# Patient Record
Sex: Male | Born: 1945
Health system: Southern US, Community
[De-identification: ages and names within clinical notes are randomized; demographics above are authoritative.]

## PROBLEM LIST (undated history)

## (undated) DIAGNOSIS — T7840XA Allergy, unspecified, initial encounter: Secondary | ICD-10-CM

## (undated) DIAGNOSIS — I8390 Asymptomatic varicose veins of unspecified lower extremity: Secondary | ICD-10-CM

## (undated) HISTORY — DX: Asymptomatic varicose veins of unspecified lower extremity: I83.90

## (undated) HISTORY — DX: Allergy, unspecified, initial encounter: T78.40XA

---

## 2003-07-07 ENCOUNTER — Emergency Department (HOSPITAL_COMMUNITY): Admission: EM | Admit: 2003-07-07 | Discharge: 2003-07-07 | Payer: Self-pay | Admitting: Family Medicine

## 2003-07-09 ENCOUNTER — Emergency Department (HOSPITAL_COMMUNITY): Admission: EM | Admit: 2003-07-09 | Discharge: 2003-07-09 | Payer: Self-pay | Admitting: Family Medicine

## 2013-05-30 ENCOUNTER — Ambulatory Visit (INDEPENDENT_AMBULATORY_CARE_PROVIDER_SITE_OTHER): Payer: BC Managed Care – PPO | Admitting: Family Medicine

## 2013-05-30 VITALS — BP 126/64 | HR 61 | Temp 98.0°F | Resp 16 | Ht 66.0 in | Wt 137.0 lb

## 2013-05-30 DIAGNOSIS — Z8669 Personal history of other diseases of the nervous system and sense organs: Secondary | ICD-10-CM

## 2013-05-30 DIAGNOSIS — G629 Polyneuropathy, unspecified: Secondary | ICD-10-CM

## 2013-05-30 DIAGNOSIS — N529 Male erectile dysfunction, unspecified: Secondary | ICD-10-CM

## 2013-05-30 DIAGNOSIS — L299 Pruritus, unspecified: Secondary | ICD-10-CM | POA: Insufficient documentation

## 2013-05-30 DIAGNOSIS — R350 Frequency of micturition: Secondary | ICD-10-CM

## 2013-05-30 DIAGNOSIS — B351 Tinea unguium: Secondary | ICD-10-CM

## 2013-05-30 DIAGNOSIS — D649 Anemia, unspecified: Secondary | ICD-10-CM

## 2013-05-30 DIAGNOSIS — G609 Hereditary and idiopathic neuropathy, unspecified: Secondary | ICD-10-CM

## 2013-05-30 LAB — COMPREHENSIVE METABOLIC PANEL
ALBUMIN: 4.6 g/dL (ref 3.5–5.2)
ALT: 13 U/L (ref 0–53)
AST: 20 U/L (ref 0–37)
Alkaline Phosphatase: 71 U/L (ref 39–117)
BUN: 12 mg/dL (ref 6–23)
CALCIUM: 9.7 mg/dL (ref 8.4–10.5)
CHLORIDE: 105 meq/L (ref 96–112)
CO2: 26 meq/L (ref 19–32)
Creat: 0.86 mg/dL (ref 0.50–1.35)
Glucose, Bld: 91 mg/dL (ref 70–99)
Potassium: 5 mEq/L (ref 3.5–5.3)
Sodium: 140 mEq/L (ref 135–145)
TOTAL PROTEIN: 8.4 g/dL — AB (ref 6.0–8.3)
Total Bilirubin: 0.7 mg/dL (ref 0.2–1.2)

## 2013-05-30 LAB — POCT CBC
Granulocyte percent: 38.8 %G (ref 37–80)
HCT, POC: 38.1 % — AB (ref 43.5–53.7)
HEMOGLOBIN: 11.6 g/dL — AB (ref 14.1–18.1)
Lymph, poc: 2.7 (ref 0.6–3.4)
MCH, POC: 23.5 pg — AB (ref 27–31.2)
MCHC: 30.4 g/dL — AB (ref 31.8–35.4)
MCV: 77.3 fL — AB (ref 80–97)
MID (cbc): 0.5 (ref 0–0.9)
MPV: 9.1 fL (ref 0–99.8)
POC GRANULOCYTE: 2 (ref 2–6.9)
POC LYMPH PERCENT: 52.2 %L — AB (ref 10–50)
POC MID %: 9 %M (ref 0–12)
Platelet Count, POC: 252 10*3/uL (ref 142–424)
RBC: 4.93 M/uL (ref 4.69–6.13)
RDW, POC: 17.6 %
WBC: 5.2 10*3/uL (ref 4.6–10.2)

## 2013-05-30 LAB — GLUCOSE, POCT (MANUAL RESULT ENTRY): POC Glucose: 91 mg/dl (ref 70–99)

## 2013-05-30 MED ORDER — SILDENAFIL CITRATE 100 MG PO TABS: ORAL_TABLET | ORAL | Status: DC

## 2013-05-30 MED ORDER — DOXEPIN HCL 10 MG PO CAPS: ORAL_CAPSULE | ORAL | Status: DC

## 2013-05-30 NOTE — Patient Instructions (Addendum)
Take Ditropan one pill at bedtime for itching. If after one week this is not helping enough try increasing to 2 at bedtime  Use some Aveeno lotion on the dry and itchy skin one time every day  Take Claritin (loratadine) one every morning also for itching. This should help the eyes also. This is over-the-counter and you do not need a prescription for it.  If the eyes keep bothering you go see an eye doctor  Take iron one pill every day with food and a glass of water. This is also over-the-counter and you do not need a prescription. Ask the pharmacist to help you find it.  Return in 6 weeks for me to recheck your blood level. Call and ask when Dr. Alwyn RenHopper will be in the office and try to come in when I am working  Return sooner if problems.   Go on the computer and try and see if you can find a coupon for a free viagra prescription. This is expensive.  Get a pill splitter and try using 1/4 to 1/2 dose before sex.

## 2013-05-30 NOTE — Progress Notes (Signed)
Subjective [age] year old gentleman originally from [place] who presents for the first time with complaints of one year of problems with generalized itching. The skin on his back itches. He is truck itches. He gets discomfort to the sides of his chest sometimes. He has problems with the skin on the back of his hands. Sometimes his hands seem to draw a little. He has itching and burning for his eyes. He is generally healthy. He is not on any regular medications. He has not seen an eye doctor for a long time. He gets a hot paresthesia under his feet. Review of systems was fairly unremarkable. No other HEENT problems except occasional sore throat. Respiratory: Unremarkable Cardiovascular: Unremarkable GI: Unremarkable GU: Unremarkable. Says he can pee too much, does drink a lot of water. Musculoskeletal unremarkable except for the drawing in his hands. Dermatologic: Itching and burning if skin on feet and itching of the rest of the body.  Objective: Well-developed gentleman in no major distress. Eyes PERL. Can see the red reflex but I cannot get a good view of the fundus. I do not see any cataracts. Pupils are little constricted. TMs normal. Throat clear. Neck supple without nodes. Chest clear. Heart regular without murmurs. Abdomen soft and nontender. Skin is a little dry but no obvious rashes. The back of his hands have some thickening of the skin. Hand function seems normal at this time. Feet have dry skin and some onychomycosis of most of the nails.  Assessment: Pruritus Itching eyes Occasional carpal spasm Peripheral neuropathy of feet with burning skin  Plan: Check labs Treat with several medications. See instructions  Results for orders placed in visit on 05/30/13  GLUCOSE, POCT (MANUAL RESULT ENTRY)      Result Value Ref Range   POC Glucose 91  70 - 99 mg/dl  POCT CBC      Result Value Ref Range   WBC 5.2  4.6 - 10.2 K/uL   Lymph, poc 2.7  0.6 - 3.4   POC LYMPH PERCENT 52.2 (*) 10 - 50  %L   MID (cbc) 0.5  0 - 0.9   POC MID % 9.0  0 - 12 %M   POC Granulocyte 2.0  2 - 6.9   Granulocyte percent 38.8  37 - 80 %G   RBC 4.93  4.69 - 6.13 M/uL   Hemoglobin 11.6 (*) 14.1 - 18.1 g/dL   HCT, POC 04.538.1 (*) 40.943.5 - 53.7 %   MCV 77.3 (*) 80 - 97 fL   MCH, POC 23.5 (*) 27 - 31.2 pg   MCHC 30.4 (*) 31.8 - 35.4 g/dL   RDW, POC 81.117.6     Platelet Count, POC 252  142 - 424 K/uL   MPV 9.1  0 - 99.8 fL   Incidentally patient complained of erectile difficulty. See his discharge instructions which I detailed all his medications.

## 2013-05-31 LAB — TSH: TSH: 1.808 u[IU]/mL (ref 0.350–4.500)

## 2013-05-31 LAB — FERRITIN: Ferritin: 8 ng/mL — ABNORMAL LOW (ref 22–322)

## 2013-06-04 NOTE — Addendum Note (Signed)
Addended by: Elease EtienneHANSEN, SARA A on: 06/04/2013 10:41 AM   Modules accepted: Orders

## 2013-07-20 ENCOUNTER — Encounter: Payer: Self-pay | Admitting: Family Medicine

## 2013-09-11 ENCOUNTER — Ambulatory Visit: Payer: BC Managed Care – PPO | Admitting: Family Medicine

## 2014-05-04 DIAGNOSIS — H25093 Other age-related incipient cataract, bilateral: Secondary | ICD-10-CM | POA: Diagnosis not present

## 2015-02-25 ENCOUNTER — Encounter: Payer: Self-pay | Admitting: Family Medicine

## 2015-02-25 ENCOUNTER — Ambulatory Visit (INDEPENDENT_AMBULATORY_CARE_PROVIDER_SITE_OTHER): Payer: BC Managed Care – PPO | Admitting: Family Medicine

## 2015-02-25 ENCOUNTER — Ambulatory Visit (INDEPENDENT_AMBULATORY_CARE_PROVIDER_SITE_OTHER): Payer: BC Managed Care – PPO

## 2015-02-25 VITALS — BP 139/78 | HR 67 | Temp 97.7°F | Resp 16 | Ht 65.5 in | Wt 125.5 lb

## 2015-02-25 DIAGNOSIS — R059 Cough, unspecified: Secondary | ICD-10-CM

## 2015-02-25 DIAGNOSIS — R350 Frequency of micturition: Secondary | ICD-10-CM | POA: Diagnosis not present

## 2015-02-25 DIAGNOSIS — I8311 Varicose veins of right lower extremity with inflammation: Secondary | ICD-10-CM

## 2015-02-25 DIAGNOSIS — J069 Acute upper respiratory infection, unspecified: Secondary | ICD-10-CM

## 2015-02-25 DIAGNOSIS — S29011A Strain of muscle and tendon of front wall of thorax, initial encounter: Secondary | ICD-10-CM | POA: Diagnosis not present

## 2015-02-25 DIAGNOSIS — R05 Cough: Secondary | ICD-10-CM

## 2015-02-25 DIAGNOSIS — I8312 Varicose veins of left lower extremity with inflammation: Secondary | ICD-10-CM | POA: Diagnosis not present

## 2015-02-25 DIAGNOSIS — J0101 Acute recurrent maxillary sinusitis: Secondary | ICD-10-CM | POA: Diagnosis not present

## 2015-02-25 DIAGNOSIS — R0789 Other chest pain: Secondary | ICD-10-CM | POA: Diagnosis not present

## 2015-02-25 LAB — POCT URINALYSIS DIP (MANUAL ENTRY)
BILIRUBIN UA: NEGATIVE
Bilirubin, UA: NEGATIVE
GLUCOSE UA: NEGATIVE
LEUKOCYTES UA: NEGATIVE
NITRITE UA: NEGATIVE
Protein Ur, POC: NEGATIVE
Spec Grav, UA: 1.02
Urobilinogen, UA: 0.2
pH, UA: 6

## 2015-02-25 LAB — CBC WITH DIFFERENTIAL/PLATELET
Basophils Absolute: 0 10*3/uL (ref 0.0–0.1)
Basophils Relative: 0 % (ref 0–1)
EOS ABS: 0.1 10*3/uL (ref 0.0–0.7)
EOS PCT: 2 % (ref 0–5)
HEMATOCRIT: 40.8 % (ref 39.0–52.0)
Hemoglobin: 13.6 g/dL (ref 13.0–17.0)
LYMPHS PCT: 42 % (ref 12–46)
Lymphs Abs: 2.2 10*3/uL (ref 0.7–4.0)
MCH: 30.9 pg (ref 26.0–34.0)
MCHC: 33.3 g/dL (ref 30.0–36.0)
MCV: 92.7 fL (ref 78.0–100.0)
MONO ABS: 0.5 10*3/uL (ref 0.1–1.0)
MPV: 12.3 fL (ref 8.6–12.4)
Monocytes Relative: 9 % (ref 3–12)
Neutro Abs: 2.4 10*3/uL (ref 1.7–7.7)
Neutrophils Relative %: 47 % (ref 43–77)
PLATELETS: 186 10*3/uL (ref 150–400)
RBC: 4.4 MIL/uL (ref 4.22–5.81)
RDW: 13.8 % (ref 11.5–15.5)
WBC: 5.2 10*3/uL (ref 4.0–10.5)

## 2015-02-25 LAB — COMPREHENSIVE METABOLIC PANEL
ALBUMIN: 4.2 g/dL (ref 3.6–5.1)
ALT: 15 U/L (ref 9–46)
AST: 18 U/L (ref 10–35)
Alkaline Phosphatase: 63 U/L (ref 40–115)
BILIRUBIN TOTAL: 0.4 mg/dL (ref 0.2–1.2)
BUN: 10 mg/dL (ref 7–25)
CALCIUM: 9.3 mg/dL (ref 8.6–10.3)
CHLORIDE: 107 mmol/L (ref 98–110)
CO2: 26 mmol/L (ref 20–31)
Creat: 0.92 mg/dL (ref 0.70–1.25)
Glucose, Bld: 78 mg/dL (ref 65–99)
POTASSIUM: 4 mmol/L (ref 3.5–5.3)
Sodium: 141 mmol/L (ref 135–146)
Total Protein: 7.7 g/dL (ref 6.1–8.1)

## 2015-02-25 LAB — POC MICROSCOPIC URINALYSIS (UMFC): Mucus: ABSENT

## 2015-02-25 MED ORDER — METHOCARBAMOL 500 MG PO TABS: 500.0000 mg | ORAL_TABLET | Freq: Four times a day (QID) | ORAL | Status: DC

## 2015-02-25 MED ORDER — FLUTICASONE PROPIONATE 50 MCG/ACT NA SUSP: 2.0000 | Freq: Every day | NASAL | Status: DC

## 2015-02-25 MED ORDER — AMOXICILLIN-POT CLAVULANATE 875-125 MG PO TABS: 1.0000 | ORAL_TABLET | Freq: Two times a day (BID) | ORAL | Status: DC

## 2015-02-25 MED ORDER — TRIAMCINOLONE ACETONIDE 0.1 % EX OINT: 1.0000 "application " | TOPICAL_OINTMENT | Freq: Two times a day (BID) | CUTANEOUS | Status: DC

## 2015-02-25 NOTE — Progress Notes (Signed)
Subjective:    Patient ID: Bob Scott, male    DOB: 1946-03-02, 69 y.o.   MRN: [id number]  02/25/2015  Generalized Body Aches   HPI This 69 y.o. male presents for evaluation of generalized body aches.  Having R sided chest pain three months ago.  Constant pain; pain with deep breathing; coughing last week a lot.  Intermittent cough for the past two weeks.  Rhinorrhea.  Thick; brown.  +HA. No ear pain.  No SOB but cannot take breathe deep.  +wheezing; +tobacco; quit smoking two months ago.  Smoking for 6 years.  Works at [employer] in housekeeping.  B varicose veins:  Present; itching.  Foot can be warm to touch.  Feet an burn like pepper; hot to touch.     Review of Systems  Constitutional: Negative for fever, chills, diaphoresis, activity change, appetite change and fatigue.  HENT: Positive for congestion, postnasal drip, rhinorrhea and sinus pressure. Negative for ear pain, sore throat, trouble swallowing and voice change.   Eyes: Negative for visual disturbance.  Respiratory: Positive for cough. Negative for shortness of breath.   Cardiovascular: Positive for chest pain. Negative for palpitations and leg swelling.  Gastrointestinal: Negative for nausea, vomiting, abdominal pain and diarrhea.  Endocrine: Negative for cold intolerance, heat intolerance, polydipsia, polyphagia and polyuria.  Genitourinary: Positive for urgency and frequency. Negative for dysuria, hematuria, decreased urine volume, discharge, penile swelling, scrotal swelling, genital sores, penile pain and testicular pain.  Musculoskeletal: Positive for myalgias, back pain and arthralgias. Negative for joint swelling, gait problem, neck pain and neck stiffness.  Skin: Negative for color change, pallor, rash and wound.  Neurological: Negative for dizziness, tremors, seizures, syncope, facial asymmetry, speech difficulty, weakness, light-headedness, numbness and headaches.    Past Medical History  Diagnosis Date  . Allergy    . Varicose vein    History reviewed. No pertinent past surgical history. No Known Allergies  Social History   Social History  . Marital Status: Single    Spouse Name: N/A  . Number of Children: 14  . Years of Education: N/A   Occupational History  . housekeeping     [employer]   Social History Main Topics  . Smoking status: Former Smoker -- 1.00 packs/day for 7 years    Quit date: 03/29/2013  . Smokeless tobacco: Not on file  . Alcohol Use: No  . Drug Use: No  . Sexual Activity: Yes    Birth Control/ Protection: Post-menopausal   Other Topics Concern  . Not on file   Social History Narrative   Marital status: married; from [place]      Children: 14      Lives: with wife, 3 children.      Employment; housekeeping [employer]      Tobacco: quit in 2012; smoked x 6 years      Alcohol: none     Drugs: none   History reviewed. No pertinent family history.     Objective:    BP 139/78 mmHg  Pulse 67  Temp(Src) 97.7 F (36.5 C) (Oral)  Resp 16  Ht 5' 5.5" (1.664 m)  Wt 125 lb 8 oz (56.926 kg)  BMI 20.56 kg/m2 Physical Exam  Constitutional: He is oriented to person, place, and time. He appears well-developed and well-nourished. No distress.  HENT:  Head: Normocephalic and atraumatic.  Right Ear: External ear normal.  Left Ear: External ear normal.  Nose: Nose normal.  Mouth/Throat: Oropharynx is clear and moist.  Eyes: Conjunctivae and EOM  are normal. Pupils are equal, round, and reactive to light.  Neck: Normal range of motion. Neck supple. Carotid bruit is not present. No thyromegaly present.  Cardiovascular: Normal rate, regular rhythm, normal heart sounds and intact distal pulses.  Exam reveals no gallop and no friction rub.   No murmur heard. +small varicose veins B legs with chronic mild skin changes.  Pulmonary/Chest: Effort normal and breath sounds normal. He has no wheezes. He has no rales. He exhibits tenderness.    +TTP R anterior chest wall diffusely.    Abdominal: Soft. Bowel sounds are normal. He exhibits no distension and no mass. There is no tenderness. There is no rebound and no guarding.  Lymphadenopathy:    He has no cervical adenopathy.  Neurological: He is alert and oriented to person, place, and time. No cranial nerve deficit.  Skin: Skin is warm and dry. No rash noted. He is not diaphoretic.  Psychiatric: He has a normal mood and affect. His behavior is normal.  Nursing note and vitals reviewed.   [facility] reading (PRIMARY) by  Dr. Katrinka Blazing.  CXR: NAD     Assessment & Plan:   1. Other chest pain   2. Cough   3. Acute upper respiratory infection   4. Urinary frequency   5. Acute recurrent maxillary sinusitis   6. Chest wall muscle strain, initial encounter   7. Varicose veins of both lower extremities with inflammation     1. R sided chest pain/chest wall strain: New.  Rx for Robaxin provided; avoid repetitive or heavy lifting.   2.  Acute sinusitis with cough: New. Rx for Augmentin and Flonase.   3.  Varicose veins with dermatitis: New.  Rx for triamcinolone ointment 0.1% to apply bid to legs. 4.  Urinary frequency:  New.  Obtain PSA.     Orders Placed This Encounter  Procedures  . DG Chest 2 View    Standing Status: Future     Number of Occurrences: 1     Standing Expiration Date: 02/25/2016    Order Specific Question:  Reason for Exam (SYMPTOM  OR DIAGNOSIS REQUIRED)    Answer:  R sided chest pain, cough for one month; from [place]    Order Specific Question:  Preferred imaging location?    Answer:  External  . CBC with Differential/Platelet  . Comprehensive metabolic panel  . PSA  . POCT urinalysis dipstick  . POCT Microscopic Urinalysis ([facility])   Meds ordered this encounter  Medications  . amoxicillin-clavulanate (AUGMENTIN) 875-125 MG tablet    Sig: Take 1 tablet by mouth 2 (two) times daily.    Dispense:  20 tablet    Refill:  0  . fluticasone (FLONASE) 50 MCG/ACT nasal spray    Sig: Place 2 sprays into  both nostrils daily.    Dispense:  16 g    Refill:  6  . methocarbamol (ROBAXIN) 500 MG tablet    Sig: Take 1-2 tablets (500-1,000 mg total) by mouth 4 (four) times daily.    Dispense:  40 tablet    Refill:  0  . triamcinolone ointment (KENALOG) 0.1 %    Sig: Apply 1 application topically 2 (two) times daily.    Dispense:  80 g    Refill:  0    Return in about 3 months (around 05/28/2015) for complete physical examiniation.    Arrionna Serena Paulita Fujita, M.D. [facility] [address] [place], [place]  [address] [phone] phone [phone])  [phone] fax

## 2015-02-26 ENCOUNTER — Encounter: Payer: Self-pay | Admitting: Family Medicine

## 2015-02-26 LAB — PSA: PSA: 0.77 ng/mL (ref <=4.00)

## 2015-03-15 ENCOUNTER — Encounter: Payer: Self-pay | Admitting: Family Medicine

## 2015-04-25 ENCOUNTER — Telehealth: Payer: Self-pay

## 2015-04-25 NOTE — Telephone Encounter (Signed)
Mailed letter to patient stating appointment change with Dr. Katrinka BlazingSmith. Forde RadonAJ

## 2015-06-03 ENCOUNTER — Encounter: Payer: Medicare Other | Admitting: Family Medicine

## 2015-06-14 ENCOUNTER — Encounter: Payer: Medicare Other | Admitting: Family Medicine

## 2016-05-10 ENCOUNTER — Ambulatory Visit (INDEPENDENT_AMBULATORY_CARE_PROVIDER_SITE_OTHER): Payer: Medicare Other

## 2016-05-10 ENCOUNTER — Ambulatory Visit (INDEPENDENT_AMBULATORY_CARE_PROVIDER_SITE_OTHER): Payer: Medicare Other | Admitting: Physician Assistant

## 2016-05-10 VITALS — BP 140/74 | HR 69 | Temp 98.9°F | Resp 16 | Ht 65.5 in | Wt 123.6 lb

## 2016-05-10 DIAGNOSIS — R0781 Pleurodynia: Secondary | ICD-10-CM | POA: Diagnosis not present

## 2016-05-10 DIAGNOSIS — R079 Chest pain, unspecified: Secondary | ICD-10-CM | POA: Diagnosis not present

## 2016-05-10 DIAGNOSIS — R05 Cough: Secondary | ICD-10-CM | POA: Diagnosis not present

## 2016-05-10 DIAGNOSIS — J22 Unspecified acute lower respiratory infection: Secondary | ICD-10-CM | POA: Diagnosis not present

## 2016-05-10 MED ORDER — GUAIFENESIN ER 1200 MG PO TB12: 1.0000 | ORAL_TABLET | Freq: Two times a day (BID) | ORAL | 1 refills | Status: DC | PRN

## 2016-05-10 MED ORDER — AZITHROMYCIN 250 MG PO TABS: ORAL_TABLET | ORAL | 0 refills | Status: DC

## 2016-05-10 MED ORDER — BENZONATATE 100 MG PO CAPS: 100.0000 mg | ORAL_CAPSULE | Freq: Three times a day (TID) | ORAL | 0 refills | Status: DC | PRN

## 2016-05-10 MED ORDER — HYDROCOD POLST-CPM POLST ER 10-8 MG/5ML PO SUER: 5.0000 mL | Freq: Every evening | ORAL | 0 refills | Status: DC | PRN

## 2016-05-10 NOTE — Patient Instructions (Addendum)
  IF you received an x-ray today, you will receive an invoice from [facility]. Please contact [facility] at [phone] with questions or concerns regarding your invoice.   IF you received labwork today, you will receive an invoice from [facility]. Please contact [facility] at [phone] with questions or concerns regarding your invoice.   Our billing staff will not be able to assist you with questions regarding bills from these companies.  You will be contacted with the lab results as soon as they are available. The fastest way to get your results is to activate your My Chart account. Instructions are located on the last page of this paperwork. If you have not heard from us regarding the results in 2 weeks, please contact this office.   Please make sure that you are hydrating well. Take antibiotic as prescribed. The tussionex can make you sleep so be careful.   Community-Acquired Pneumonia, Adult Introduction Pneumonia is an infection of the lungs. One type of pneumonia can happen while a person is in a hospital. A different type can happen when a person is not in a hospital (community-acquired pneumonia). It is easy for this kind to spread from person to person. It can spread to you if you breathe near an infected person who coughs or sneezes. Some symptoms include:  A dry cough.  A wet (productive) cough.  Fever.  Sweating.  Chest pain. Follow these instructions at home:  Take over-the-counter and prescription medicines only as told by your doctor.  Only take cough medicine if you are losing sleep.  If you were prescribed an antibiotic medicine, take it as told by your doctor. Do not stop taking the antibiotic even if you start to feel better.  Sleep with your head and neck raised (elevated). You can do this by putting a few pillows under your head, or you can sleep in a recliner.  Do not use tobacco products. These include cigarettes, chewing tobacco, and  e-cigarettes. If you need help quitting, ask your doctor.  Drink enough water to keep your pee (urine) clear or pale yellow. A shot (vaccine) can help prevent pneumonia. Shots are often suggested for:  People older than 71 years of age.  People older than 71 years of age:  Who are having cancer treatment.  Who have long-term (chronic) lung disease.  Who have problems with their body's defense system (immune system). You may also prevent pneumonia if you take these actions:  Get the flu (influenza) shot every year.  Go to the dentist as often as told.  Wash your hands often. If soap and water are not available, use hand sanitizer. Contact a doctor if:  You have a fever.  You lose sleep because your cough medicine does not help. Get help right away if:  You are short of breath and it gets worse.  You have more chest pain.  Your sickness gets worse. This is very serious if:  You are an older adult.  Your body's defense system is weak.  You cough up blood. This information is not intended to replace advice given to you by your health care provider. Make sure you discuss any questions you have with your health care provider. Document Released: 09/12/2007 Document Revised: 09/01/2015 Document Reviewed: 07/21/2014  2017 Elsevier

## 2016-05-10 NOTE — Progress Notes (Signed)
[facility] [address] [place] [address] [phone]  Date:  05/10/2016   Name:  Bob Scott   DOB:  [birth year]   MRN:  [id number]  PCP:  No primary care provider on file.    History of Present Illness:  Bob Scott is a 71 y.o. male patient who presents to [facility] for cough, runny nose, and ear pain. Coughing for 2 weeks, on the right side and chest.  He can not lay on his side due to the coughing.  Rhinorrhea, and headache.  Subjective fever and chills.  No sob or dyspnea.  He can not breathe well because of the coughing.  Ear pain with coughing.  thorat hurts with coughing as well.  He has taken ibuprofen which helps minimally.  No known weight loss.    Feels like  Wt Readings from Last 3 Encounters:  05/10/16 123 lb 9.6 oz (56.1 kg)  02/25/15 125 lb 8 oz (56.9 kg)  05/30/13 137 lb (62.1 kg)     Chief Complaint  Patient presents with  . Cough    x 2 wks  . hurts to cough on the right side    per pt "I can't lay on my right side"   . both hands "grip down"    x 2 wks, and toes      Patient Active Problem List   Diagnosis Date Noted  . Onychomycosis 05/30/2013  . Itching 05/30/2013  . History of itching of eye 05/30/2013  . Peripheral neuropathy (HCC) 05/30/2013    Past Medical History:  Diagnosis Date  . Allergy   . Varicose vein     No past surgical history on file.  Social History  Substance Use Topics  . Smoking status: Former Smoker    Packs/day: 1.00    Years: 7.00    Quit date: 03/29/2013  . Smokeless tobacco: Never Used  . Alcohol use No    No family history on file.  No Known Allergies  Medication list has been reviewed and updated.  Current Outpatient Prescriptions on File Prior to Visit  Medication Sig Dispense Refill  . fluticasone (FLONASE) 50 MCG/ACT nasal spray Place 2 sprays into both nostrils daily. (Patient not taking: Reported on 05/10/2016) 16 g 6  . methocarbamol (ROBAXIN) 500 MG tablet Take 1-2 tablets  (500-1,000 mg total) by mouth 4 (four) times daily. (Patient not taking: Reported on 05/10/2016) 40 tablet 0  . triamcinolone ointment (KENALOG) 0.1 % Apply 1 application topically 2 (two) times daily. (Patient not taking: Reported on 05/10/2016) 80 g 0   No current facility-administered medications on file prior to visit.     ROS ROS otherwise unremarkable unless listed above.   Physical Examination: BP 140/74   Pulse 69   Temp 98.9 F (37.2 C) (Oral)   Resp 16   Ht 5' 5.5" (1.664 m)   Wt 123 lb 9.6 oz (56.1 kg)   SpO2 98%   BMI 20.26 kg/m  Ideal Body Weight: Weight in (lb) to have BMI = 25: 152.2 Wt Readings from Last 3 Encounters:  05/10/16 123 lb 9.6 oz (56.1 kg)  02/25/15 125 lb 8 oz (56.9 kg)  05/30/13 137 lb (62.1 kg)    Physical Exam  Constitutional: He is oriented to person, place, and time. He appears well-developed and well-nourished. No distress.  HENT:  Head: Atraumatic.  Right Ear: Tympanic membrane, external ear and ear canal normal.  Left Ear: Tympanic membrane, external ear and  ear canal normal.  Nose: Mucosal edema and rhinorrhea present. Right sinus exhibits no maxillary sinus tenderness and no frontal sinus tenderness. Left sinus exhibits no maxillary sinus tenderness and no frontal sinus tenderness.  Mouth/Throat: No uvula swelling. No oropharyngeal exudate, posterior oropharyngeal edema or posterior oropharyngeal erythema.  Eyes: Conjunctivae, EOM and lids are normal. Pupils are equal, round, and reactive to light. Right eye exhibits normal extraocular motion. Left eye exhibits normal extraocular motion.  Neck: Trachea normal and full passive range of motion without pain. No edema and no erythema present.  Cardiovascular: Normal rate.   Pulmonary/Chest: Effort normal. No respiratory distress. He has no decreased breath sounds. He has no wheezes. He has no rhonchi.  Right costal border.  Neurological: He is alert and oriented to person, place, and time.   Skin: Skin is warm and dry. He is not diaphoretic.  Psychiatric: He has a normal mood and affect. His behavior is normal.   Dg Chest 1 View  Result Date: 05/10/2016 CLINICAL DATA:  Cough, right rib pain EXAM: RIGHT RIBS AND CHEST - 3+ VIEW; CHEST  1 VIEW COMPARISON:  05/10/2016 FINDINGS: RIGHT RIBS: No fracture or other bone lesions are seen involving the ribs. There is no evidence of pneumothorax or pleural effusion. Right middle lobe airspace disease. Heart size and mediastinal contours are within normal limits. CHEST X-RAY: There is right middle lobe airspace disease most concerning for pneumonia. The lungs are hyperinflated likely secondary to COPD. There is no pleural effusion or pneumothorax. The heart and mediastinal contours are unremarkable. The osseous structures are unremarkable. IMPRESSION: 1.  No acute osseous injury of the right ribs. 2. Right middle lobe airspace disease most concerning for pneumonia. Followup PA and lateral chest X-ray is recommended in 3-4 weeks following trial of antibiotic therapy to ensure resolution and exclude underlying malignancy. Electronically Signed   By: Elige KoHetal  Patel   On: 05/10/2016 12:59   Dg Ribs Unilateral W/chest Right  Result Date: 05/10/2016 CLINICAL DATA:  Cough, right rib pain EXAM: RIGHT RIBS AND CHEST - 3+ VIEW; CHEST  1 VIEW COMPARISON:  05/10/2016 FINDINGS: RIGHT RIBS: No fracture or other bone lesions are seen involving the ribs. There is no evidence of pneumothorax or pleural effusion. Right middle lobe airspace disease. Heart size and mediastinal contours are within normal limits. CHEST X-RAY: There is right middle lobe airspace disease most concerning for pneumonia. The lungs are hyperinflated likely secondary to COPD. There is no pleural effusion or pneumothorax. The heart and mediastinal contours are unremarkable. The osseous structures are unremarkable. IMPRESSION: 1.  No acute osseous injury of the right ribs. 2. Right middle lobe airspace  disease most concerning for pneumonia. Followup PA and lateral chest X-ray is recommended in 3-4 weeks following trial of antibiotic therapy to ensure resolution and exclude underlying malignancy. Electronically Signed   By: Elige KoHetal  Patel   On: 05/10/2016 12:59   Assessment and Plan: Bob Scott is a 71 y.o. male who is here today for cc of cough. --advised to return in 3 weeks for follow up. --concerning symptoms discussed with patient.  Right-sided chest pain - Plan: DG Ribs Unilateral W/Chest Right, DG Chest 1 View  Lower respiratory infection (e.g., bronchitis, pneumonia, pneumonitis, pulmonitis) - Plan: azithromycin (ZITHROMAX) 250 MG tablet, benzonatate (TESSALON) 100 MG capsule, chlorpheniramine-HYDROcodone (TUSSIONEX PENNKINETIC ER) 10-8 MG/5ML SUER, Guaifenesin (MUCINEX MAXIMUM STRENGTH) 1200 MG TB12  Trena PlattStephanie Adalea Handler, PA-C [facility] 2/5/201810:33 AM

## 2016-06-15 ENCOUNTER — Ambulatory Visit: Payer: Medicare Other

## 2016-06-22 ENCOUNTER — Ambulatory Visit: Payer: Medicare Other

## 2017-06-23 ENCOUNTER — Encounter (HOSPITAL_COMMUNITY): Payer: Self-pay | Admitting: *Deleted

## 2017-06-23 ENCOUNTER — Other Ambulatory Visit: Payer: Self-pay

## 2017-06-23 ENCOUNTER — Emergency Department (HOSPITAL_COMMUNITY): Payer: Medicare Other

## 2017-06-23 ENCOUNTER — Emergency Department (HOSPITAL_COMMUNITY)
Admission: EM | Admit: 2017-06-23 | Discharge: 2017-06-23 | Disposition: A | Discharge status: Home / Self Care | Payer: Medicare Other | Attending: Emergency Medicine | Admitting: Emergency Medicine

## 2017-06-23 DIAGNOSIS — S0990XA Unspecified injury of head, initial encounter: Secondary | ICD-10-CM

## 2017-06-23 DIAGNOSIS — M542 Cervicalgia: Secondary | ICD-10-CM | POA: Diagnosis not present

## 2017-06-23 DIAGNOSIS — Y9389 Activity, other specified: Secondary | ICD-10-CM | POA: Diagnosis not present

## 2017-06-23 DIAGNOSIS — Z87891 Personal history of nicotine dependence: Secondary | ICD-10-CM | POA: Diagnosis not present

## 2017-06-23 DIAGNOSIS — S199XXA Unspecified injury of neck, initial encounter: Secondary | ICD-10-CM | POA: Diagnosis not present

## 2017-06-23 DIAGNOSIS — R51 Headache: Secondary | ICD-10-CM | POA: Diagnosis not present

## 2017-06-23 DIAGNOSIS — Y998 Other external cause status: Secondary | ICD-10-CM | POA: Diagnosis not present

## 2017-06-23 DIAGNOSIS — S0081XA Abrasion of other part of head, initial encounter: Secondary | ICD-10-CM | POA: Diagnosis not present

## 2017-06-23 DIAGNOSIS — S0993XA Unspecified injury of face, initial encounter: Secondary | ICD-10-CM | POA: Diagnosis not present

## 2017-06-23 DIAGNOSIS — Y92512 Supermarket, store or market as the place of occurrence of the external cause: Secondary | ICD-10-CM | POA: Insufficient documentation

## 2017-06-23 DIAGNOSIS — S0083XA Contusion of other part of head, initial encounter: Secondary | ICD-10-CM | POA: Insufficient documentation

## 2017-06-23 MED ORDER — HYDROCODONE-ACETAMINOPHEN 5-325 MG PO TABS
1.0000 | ORAL_TABLET | Freq: Once | ORAL | Status: AC
  Administered 2017-06-23: 1 via ORAL
  Filled 2017-06-23: qty 1

## 2017-06-23 MED ORDER — HYDROCODONE-ACETAMINOPHEN 5-325 MG PO TABS: 1.0000 | ORAL_TABLET | ORAL | 0 refills | Status: DC | PRN

## 2017-06-23 NOTE — ED Notes (Signed)
Patient is back from CT.
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
## 2017-06-23 NOTE — Discharge Instructions (Signed)
You were examined today for a head injury and possible concussion.  Your head CT showed no evidence of  injury today. CT of your neck looks good. Facial CT shows a hematoma over the left cheek involving the muscle and parotid gland. Please use ice on the facial today and tomorrow, then you may use heat and warm compresses. You may use pain medication as needed, this medication can make you drowsy and may be habit forming.  Sometimes serious problems can develop after a head injury. Please return to the emergency department if you experience any of the following symptoms: Repeated vomiting Headache that gets worse and does not go away Loss of consciousness or inability to stay awake at times when you normally would be able to Getting more confused, restless or agitated Convulsions or seizures Difficulty walking or feeling off balance Weakness or numbness Vision changes  Signs and Symptoms of Concussion: A concussion is a very mild traumatic brain injury caused by a bump, jolt or blow to the head, most people recover quickly and fully. You can experience a wide variety of symptoms including:   - Confusion      - Difficulty concentrating       - Trouble remembering new info  - Headache      - Dizziness        - Fuzzy or blurry vision  - Fatigue      - Balance problems      - Light sensitivity  - Mood swings     - Changes in sleep or difficulty sleeping   To help these symptoms improve make sure you are getting plenty of rest, avoid screen time, loud music and strenuous mental activities. Avoid any strenuous physical activities, once your symptoms have resolved a slow and gradual return to activity is recommended. It is very important that you avoid situations in which you might sustain a second head injury as this can be very dangerous and life threatening. You cannot be medically cleared to return to normal activities until you have followed up with your primary doctor or a concussion specialist  for reevaluation.

## 2017-06-23 NOTE — ED Notes (Signed)
Patient transported to CT 
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
## 2019-07-13 ENCOUNTER — Other Ambulatory Visit: Payer: Self-pay

## 2019-07-13 ENCOUNTER — Ambulatory Visit (INDEPENDENT_AMBULATORY_CARE_PROVIDER_SITE_OTHER): Payer: BC Managed Care – PPO | Admitting: Family Medicine

## 2019-07-13 ENCOUNTER — Ambulatory Visit (INDEPENDENT_AMBULATORY_CARE_PROVIDER_SITE_OTHER): Payer: BC Managed Care – PPO

## 2019-07-13 ENCOUNTER — Encounter: Payer: Self-pay | Admitting: Family Medicine

## 2019-07-13 VITALS — BP 128/75 | HR 96 | Temp 98.1°F | Ht 65.52 in | Wt 136.0 lb

## 2019-07-13 DIAGNOSIS — M545 Low back pain, unspecified: Secondary | ICD-10-CM

## 2019-07-13 DIAGNOSIS — H538 Other visual disturbances: Secondary | ICD-10-CM

## 2019-07-13 DIAGNOSIS — R202 Paresthesia of skin: Secondary | ICD-10-CM

## 2019-07-13 DIAGNOSIS — J309 Allergic rhinitis, unspecified: Secondary | ICD-10-CM

## 2019-07-13 DIAGNOSIS — R200 Anesthesia of skin: Secondary | ICD-10-CM

## 2019-07-13 DIAGNOSIS — Z131 Encounter for screening for diabetes mellitus: Secondary | ICD-10-CM

## 2019-07-13 DIAGNOSIS — H04203 Unspecified epiphora, bilateral lacrimal glands: Secondary | ICD-10-CM | POA: Diagnosis not present

## 2019-07-13 DIAGNOSIS — D649 Anemia, unspecified: Secondary | ICD-10-CM

## 2019-07-13 MED ORDER — GABAPENTIN 100 MG PO CAPS: 100.0000 mg | ORAL_CAPSULE | Freq: Every day | ORAL | 1 refills | Status: DC

## 2019-07-13 MED ORDER — AZELASTINE HCL 0.05 % OP SOLN: 1.0000 [drp] | Freq: Two times a day (BID) | OPHTHALMIC | 2 refills | Status: DC

## 2019-07-13 MED ORDER — FLUTICASONE PROPIONATE 50 MCG/ACT NA SUSP
2.0000 | Freq: Every day | NASAL | 6 refills | Status: DC
Start: 2019-07-13 — End: 2021-02-13

## 2019-07-13 NOTE — Progress Notes (Signed)
Subjective:  Patient ID: Bob Scott, male    DOB: [birth year]  Age: 74 y.o. MRN: [id number]  CC:  Chief Complaint  Patient presents with  . Establish Care    pt states today he feels good with no complants  . handy cap sticker    pt would like a handy cap sticker due to his feet feel like they are burning pt states it has been going on for 2 months now. pt clames no medication seem to help. hurts worse after longer periods of walking.    HPI Bob Scott presents for  New patient to establish care, but with acute concerns below.  Last seen here in February 2018 for acute infectious symptoms.  Burning in feet: Past 2 months. Back pain at times, with pain radiating down both legs at times. No bowel or bladder incontinence, no saddle anesthesia.  No injury. No new shoes.  Legs feel weak with walkiing long distances - requests handicap placard.  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 No history of diabetes. No recent bloodwork.  No change in thirst. Topical lotion for feet - ? Helps at times.   Watery eyes past 3 months. Warm feeling. No runny nose.  Itchy eyes. Some sneeze/cough at times.  Some blurry vision with distances but that has been going on for some time.  Declines referral for colonoscopy.   History Patient Active Problem List   Diagnosis Date Noted  . Onychomycosis 05/30/2013  . Itching 05/30/2013  . History of itching of eye 05/30/2013  . Peripheral neuropathy 05/30/2013   Past Medical History:  Diagnosis Date  . Allergy   . Varicose vein    No past surgical history on file. No Known Allergies Prior to Admission medications   Medication Sig Start Date End Date Taking? Authorizing Provider  azithromycin (ZITHROMAX) 250 MG tablet Take 2 tabs PO x 1 dose, then 1 tab PO QD x 4 days 05/10/16  Yes English, Stephanie D, PA  benzonatate (TESSALON) 100 MG capsule Take 1-2 capsules (100-200 mg total) by mouth 3 (three) times daily as needed for cough. 05/10/16  Yes English, Judeth Cornfield D, PA    chlorpheniramine-HYDROcodone (TUSSIONEX PENNKINETIC ER) 10-8 MG/5ML SUER Take 5 mLs by mouth at bedtime as needed. 05/10/16  Yes English, Stephanie D, PA  fluticasone (FLONASE) 50 MCG/ACT nasal spray Place 2 sprays into both nostrils daily. 02/25/15  Yes Ethelda Chick, MD  Guaifenesin [facility] MAXIMUM STRENGTH) 1200 MG TB12 Take 1 tablet (1,200 mg total) by mouth every 12 (twelve) hours as needed. 05/10/16  Yes English, Judeth Cornfield D, PA  HYDROcodone-acetaminophen (NORCO) 5-325 MG tablet Take 1 tablet by mouth every 4 (four) hours as needed. 06/23/17  Yes Dartha Lodge, PA-C  methocarbamol (ROBAXIN) 500 MG tablet Take 1-2 tablets (500-1,000 mg total) by mouth 4 (four) times daily. 02/25/15  Yes Ethelda Chick, MD  triamcinolone ointment (KENALOG) 0.1 % Apply 1 application topically 2 (two) times daily. 02/25/15  Yes Ethelda Chick, MD   Social History   Socioeconomic History  . Marital status: Single    Spouse name: Not on file  . Number of children: 14  . Years of education: Not on file  . Highest education level: Not on file  Occupational History  . Occupation: housekeeping    Comment: [employer]  Tobacco Use  . Smoking status: Former Smoker    Packs/day: 1.00    Years: 7.00    Pack years: 7.00    Quit date: 03/29/2013  Years since quitting: 6.2  . Smokeless tobacco: Never Used  Substance and Sexual Activity  . Alcohol use: No    Alcohol/week: 0.0 standard drinks  . Drug use: No  . Sexual activity: Yes    Birth control/protection: Post-menopausal  Other Topics Concern  . Not on file  Social History Narrative   Marital status: married; from [place]      Children: 14      Lives: with wife, 3 children.      Employment; housekeeping [employer]      Tobacco: quit in 2012; smoked x 6 years      Alcohol: none     Drugs: none   Social Determinants of Health   Financial Resource Strain:   . Difficulty of Paying Living Expenses:   Food Insecurity:   . Worried About Programme researcher, broadcasting/film/video  in the Last Year:   . Barista in the Last Year:   Transportation Needs:   . Freight forwarder (Medical):   Marland Kitchen Lack of Transportation (Non-Medical):   Physical Activity:   . Days of Exercise per Week:   . Minutes of Exercise per Session:   Stress:   . Feeling of Stress :   Social Connections:   . Frequency of Communication with Friends and Family:   . Frequency of Social Gatherings with Friends and Family:   . Attends Religious Services:   . Active Member of Clubs or Organizations:   . Attends Banker Meetings:   Marland Kitchen Marital Status:   Intimate Partner Violence:   . Fear of Current or Ex-Partner:   . Emotionally Abused:   Marland Kitchen Physically Abused:   . Sexually Abused:     Review of Systems Per HPI.   Objective:   Vitals:   07/13/19 1353 07/13/19 1550  BP: (!) 166/74 128/75  Pulse: 96   Temp: 98.1 F (36.7 C)   TempSrc: Temporal   SpO2: 99%   Weight: 136 lb (61.7 kg)   Height: 5' 5.52" (1.664 m)      Physical Exam Vitals reviewed.  Constitutional:      Appearance: He is well-developed.  HENT:     Head: Normocephalic and atraumatic.  Eyes:     Pupils: Pupils are equal, round, and reactive to light.     Comments: Slight arcus senilis, no apparent significant injection, small amount of pigmentation within sclera.  Neck:     Vascular: No carotid bruit or JVD.  Cardiovascular:     Rate and Rhythm: Normal rate and regular rhythm.     Heart sounds: Normal heart sounds. No murmur.  Pulmonary:     Effort: Pulmonary effort is normal.     Breath sounds: Normal breath sounds. No rales.  Musculoskeletal:     Comments: Lumbar spine pain-free range of motion, full range of motion, negative seated straight leg raise  Reflexes 1+ patella on right, 0 patella on left, unable to obtain Achilles bilaterally, negative Babinski bilaterally.  Ambulates without assistance.  Equal plantar and dorsiflexion strength of foot/toes.  Cap refill less than 1 second toes,  DP pulse 2+  Skin:    General: Skin is warm and dry.  Neurological:     Mental Status: He is alert and oriented to person, place, and time.     DG Lumbar Spine Complete  Result Date: 07/13/2019 CLINICAL DATA:  Low back pain EXAM: LUMBAR SPINE - COMPLETE 4+ VIEW COMPARISON:  None. FINDINGS: Degenerative disc and facet disease throughout the  lumbar spine. Disc disease most pronounced at L4-5. Normal alignment. No fracture. SI joints symmetric and unremarkable. IMPRESSION: Degenerative disc and facet disease.  No acute bony abnormality. Electronically Signed   By: Rolm Baptise M.D.   On: 07/13/2019 15:30    Results for orders placed or performed in visit on 07/13/19  CBC  Result Value Ref Range   WBC 8.1 3.4 - 10.8 x10E3/uL   RBC 4.40 4.14 - 5.80 x10E6/uL   Hemoglobin 9.1 (L) 13.0 - 17.7 g/dL   Hematocrit 30.7 (L) 37.5 - 51.0 %   MCV 70 (L) 79 - 97 fL   MCH 20.7 (L) 26.6 - 33.0 pg   MCHC 29.6 (L) 31.5 - 35.7 g/dL   RDW 17.8 (H) 11.6 - 15.4 %   Platelets 291 150 - 450 x10E3/uL  Comprehensive metabolic panel  Result Value Ref Range   Glucose 77 65 - 99 mg/dL   BUN 8 8 - 27 mg/dL   Creatinine, Ser 0.81 0.76 - 1.27 mg/dL   GFR calc non Af Amer 88 >59 mL/min/1.73   GFR calc Af Amer 102 >59 mL/min/1.73   BUN/Creatinine Ratio 10 10 - 24   Sodium 140 134 - 144 mmol/L   Potassium 4.5 3.5 - 5.2 mmol/L   Chloride 102 96 - 106 mmol/L   CO2 24 20 - 29 mmol/L   Calcium 9.4 8.6 - 10.2 mg/dL   Total Protein 8.2 6.0 - 8.5 g/dL   Albumin 4.6 3.7 - 4.7 g/dL   Globulin, Total 3.6 1.5 - 4.5 g/dL   Albumin/Globulin Ratio 1.3 1.2 - 2.2   Bilirubin Total 0.2 0.0 - 1.2 mg/dL   Alkaline Phosphatase 90 39 - 117 IU/L   AST 22 0 - 40 IU/L   ALT 11 0 - 44 IU/L  TSH  Result Value Ref Range   TSH 1.960 0.450 - 4.500 uIU/mL     Assessment & Plan:  Bob Scott is a 74 y.o. male . Watery eyes Blurred vision - Plan: Comprehensive metabolic panel, TSH, Ambulatory referral to Ophthalmology Allergic  rhinitis, unspecified seasonality, unspecified trigger - Plan: azelastine (OPTIVAR) 0.05 % ophthalmic solution, fluticasone (FLONASE) 50 MCG/ACT nasal spray  -Suspect component of allergic rhinitis, allergic conjunctivitis.  Trial of Optivar, Flonase, but refer to ophthalmology for further testing/evaluation.  Glucose normal.   Numbness and tingling of foot - Plan: CBC, Comprehensive metabolic panel, TSH, DG Lumbar Spine Complete, gabapentin (NEURONTIN) 100 MG capsule  -Possibly related to lumbar spine disease, does have some degenerative disease as noted above.  Initial treatment with gabapentin, follow-up to discuss further options including potential PT or orthopedic/back specialist evaluation.  -handicap placard provided for now.  Low back pain, unspecified back pain laterality, unspecified chronicity, unspecified whether sciatica present - Plan: DG Lumbar Spine Complete  -Appears to have mechanical low back pain, with degenerative disc and facet disease as above.  Symptomatic care with follow-up in 2 weeks to discuss further plans.  Screening for diabetes mellitus  -Glucose normal on electrolytes.  Anemia: Noted on labs after visit.  Previous hemoglobin of 13.6 in 2016.  We did initially discuss gastroenterology referral for routine screening colonoscopy which was declined.  We will follow-up in 2 weeks to discuss need for this testing further especially in light of anemia.  Meds ordered this encounter  Medications  . azelastine (OPTIVAR) 0.05 % ophthalmic solution    Sig: Place 1 drop into both eyes 2 (two) times daily.    Dispense:  6 mL  Refill:  2  . fluticasone (FLONASE) 50 MCG/ACT nasal spray    Sig: Place 2 sprays into both nostrils daily.    Dispense:  16 g    Refill:  6  . gabapentin (NEURONTIN) 100 MG capsule    Sig: Take 1-2 capsules (100-200 mg total) by mouth at bedtime.    Dispense:  60 capsule    Refill:  1   Patient Instructions    For watery eyes, try Optivar  drops 1 drop in each eye twice per day.  Start Flonase nasal spray for allergies.  I will also refer you to eye specialist.  For blurry eyes, see above.  I am also checking some blood work including diabetes screening  Burning in feet may be due to your back.  Try gabapentin 1 pill at bedtime, then if that is not helping can try 2 pills at bedtime.  This medicine can sometimes make you dizzy or sleepy.   Recheck in 2 weeks, sooner if any worsening of symptoms.  Thank you for coming in today and nice meeting you.  If you have lab work done today you will be contacted with your lab results within the next 2 weeks.  If you have not heard from [place] then please contact us. The fastest way to get your results is to register for My Chart.   IF you received an x-ray today, you will receive an invoice from [facility]. Please contact [facility] at [phone] with questions or concerns regarding your invoice.   IF you received labwork today, you will receive an invoice from [place]. Please contact [facility] at [phone] with questions or concerns regarding your invoice.   Our billing staff will not be able to assist you with questions regarding bills from these companies.  You will be contacted with the lab results as soon as they are available. The fastest way to get your results is to activate your My Chart account. Instructions are located on the last page of this paperwork. If you have not heard from [place] regarding the results in 2 weeks, please contact this office.         Signed, Meredith Staggers, MD [facility]

## 2019-07-13 NOTE — Patient Instructions (Addendum)
  For watery eyes, try Optivar drops 1 drop in each eye twice per day.  Start Flonase nasal spray for allergies.  I will also refer you to eye specialist.  For blurry eyes, see above.  I am also checking some blood work including diabetes screening  Burning in feet may be due to your back.  Try gabapentin 1 pill at bedtime, then if that is not helping can try 2 pills at bedtime.  This medicine can sometimes make you dizzy or sleepy.   Recheck in 2 weeks, sooner if any worsening of symptoms.  Thank you for coming in today and nice meeting you.  If you have lab work done today you will be contacted with your lab results within the next 2 weeks.  If you have not heard from [place] then please contact us. The fastest way to get your results is to register for My Chart.   IF you received an x-ray today, you will receive an invoice from [facility]. Please contact [facility] at [phone] with questions or concerns regarding your invoice.   IF you received labwork today, you will receive an invoice from [place]. Please contact [facility] at [phone] with questions or concerns regarding your invoice.   Our billing staff will not be able to assist you with questions regarding bills from these companies.  You will be contacted with the lab results as soon as they are available. The fastest way to get your results is to activate your My Chart account. Instructions are located on the last page of this paperwork. If you have not heard from [place] regarding the results in 2 weeks, please contact this office.

## 2019-07-14 ENCOUNTER — Encounter: Payer: Self-pay | Admitting: Family Medicine

## 2019-07-14 LAB — CBC
Hematocrit: 30.7 % — ABNORMAL LOW (ref 37.5–51.0)
Hemoglobin: 9.1 g/dL — ABNORMAL LOW (ref 13.0–17.7)
MCH: 20.7 pg — ABNORMAL LOW (ref 26.6–33.0)
MCHC: 29.6 g/dL — ABNORMAL LOW (ref 31.5–35.7)
MCV: 70 fL — ABNORMAL LOW (ref 79–97)
Platelets: 291 10*3/uL (ref 150–450)
RBC: 4.4 x10E6/uL (ref 4.14–5.80)
RDW: 17.8 % — ABNORMAL HIGH (ref 11.6–15.4)
WBC: 8.1 10*3/uL (ref 3.4–10.8)

## 2019-07-14 LAB — COMPREHENSIVE METABOLIC PANEL
ALT: 11 IU/L (ref 0–44)
AST: 22 IU/L (ref 0–40)
Albumin/Globulin Ratio: 1.3 (ref 1.2–2.2)
Albumin: 4.6 g/dL (ref 3.7–4.7)
Alkaline Phosphatase: 90 IU/L (ref 39–117)
BUN/Creatinine Ratio: 10 (ref 10–24)
BUN: 8 mg/dL (ref 8–27)
Bilirubin Total: 0.2 mg/dL (ref 0.0–1.2)
CO2: 24 mmol/L (ref 20–29)
Calcium: 9.4 mg/dL (ref 8.6–10.2)
Chloride: 102 mmol/L (ref 96–106)
Creatinine, Ser: 0.81 mg/dL (ref 0.76–1.27)
GFR calc Af Amer: 102 mL/min/{1.73_m2} (ref >59)
GFR calc non Af Amer: 88 mL/min/{1.73_m2} (ref >59)
Globulin, Total: 3.6 g/dL (ref 1.5–4.5)
Glucose: 77 mg/dL (ref 65–99)
Potassium: 4.5 mmol/L (ref 3.5–5.2)
Sodium: 140 mmol/L (ref 134–144)
Total Protein: 8.2 g/dL (ref 6.0–8.5)

## 2019-07-14 LAB — TSH: TSH: 1.96 u[IU]/mL (ref 0.450–4.500)

## 2019-07-17 NOTE — Progress Notes (Signed)
Called pt to give lab results, left message

## 2019-07-27 ENCOUNTER — Telehealth: Payer: Self-pay | Admitting: Family Medicine

## 2019-07-27 ENCOUNTER — Ambulatory Visit (INDEPENDENT_AMBULATORY_CARE_PROVIDER_SITE_OTHER): Payer: BC Managed Care – PPO | Admitting: Family Medicine

## 2019-07-27 ENCOUNTER — Other Ambulatory Visit: Payer: Self-pay

## 2019-07-27 ENCOUNTER — Encounter: Payer: Self-pay | Admitting: Family Medicine

## 2019-07-27 VITALS — BP 122/75 | HR 73 | Temp 97.7°F | Ht 65.52 in | Wt 136.6 lb

## 2019-07-27 DIAGNOSIS — Z1211 Encounter for screening for malignant neoplasm of colon: Secondary | ICD-10-CM

## 2019-07-27 DIAGNOSIS — L853 Xerosis cutis: Secondary | ICD-10-CM

## 2019-07-27 DIAGNOSIS — D509 Iron deficiency anemia, unspecified: Secondary | ICD-10-CM | POA: Diagnosis not present

## 2019-07-27 DIAGNOSIS — R208 Other disturbances of skin sensation: Secondary | ICD-10-CM

## 2019-07-27 LAB — IFOBT (OCCULT BLOOD): IFOBT: NEGATIVE

## 2019-07-27 NOTE — Patient Instructions (Addendum)
I will recheck blood counts but your test for anemia was low last visit.  You will need to have colonoscopy to make sure this is not coming from the intestines.  I placed a referral.  Try Claritin over-the-counter once per day to see if that helps with itching sensation on body.  Use Aveeno or Eucerin lotion for dry skin.  See other recommendations below.  Okay to take 2 gabapentin at bedtime if that helps for the symptoms.  Drink at least 64 ounces of water daily. Consider a humidifier for the room where you sleep. Bathe once daily. Avoid using HOT water, as it dries skin.  Avoid deodorant soaps (Dial is the worst!) and stick with gentle cleansers (I like Cetaphil Liquid Cleanser). After bathing, dry off completely, then apply a thick emollient cream (I like Cetaphil Moisturizing Cream, aveeno or eucerin) Apply the cream twice daily, or more!    If you have lab work done today you will be contacted with your lab results within the next 2 weeks.  If you have not heard from [place] then please contact us. The fastest way to get your results is to register for My Chart.   IF you received an x-ray today, you will receive an invoice from [facility]. Please contact [facility] at [phone] with questions or concerns regarding your invoice.   IF you received labwork today, you will receive an invoice from [place]. Please contact [facility] at [phone] with questions or concerns regarding your invoice.   Our billing staff will not be able to assist you with questions regarding bills from these companies.  You will be contacted with the lab results as soon as they are available. The fastest way to get your results is to activate your My Chart account. Instructions are located on the last page of this paperwork. If you have not heard from [place] regarding the results in 2 weeks, please contact this office.

## 2019-07-27 NOTE — Telephone Encounter (Signed)
07/27/2019 - PATIENT HAD AN OFFICE VISIT WITH DR. Neva Seat ON Monday (07/27/2019). DR. Neva Seat HAS REQUESTED HE RETURN IN ABOUT 3 WEEKS FOR HIS SKIN ITCHING AND ANEMIA. HE DID NOT STOP AT CHECK-OUT. I TRIED TO CALL AND SCHEDULE BUT HAD TO LEAVE HIM A VOICE MAIL TO RETURN MY CALL. MBC

## 2019-07-27 NOTE — Progress Notes (Signed)
Subjective:  Patient ID: Bob Scott, male    DOB: 02-02-1946  Age: 74 y.o. MRN: [id number]  CC:  Chief Complaint  Patient presents with  . Follow-up    eyes and feet feel alot better, taking the gabapentin at bed time  . Pruritis    all over the body feels as though lots of pinching on the body    HPI Ova Bob Scott presents for   Foot dysesthesias: 14-month history of and discussed that April 5 visit.  Degenerative disc disease noted on lumbar spine.  Possible lumbar source.  Treated with gabapentin 100 mg nightly.  CMP, TSH was normal. Taking gabapentin nightly - same. Improving some. Fells better in am. No back pain.  No bowel or bladder incontinence, no saddle anesthesia, no lower extremity weakness. Some soreness in legs at times.   Diffuse pruritus: Discussed treatment for possible allergic rhinitis at last visit including Flonase, Optivar. Those have been working well. Reports biting feeling all over skin. No rash. All over feeling - for years.   Anemia: Noted April 5 visit.  Previous CBC in November 2016.  Hemoglobin 13.6.  February 2015 hemoglobin 11.6.  No history of colonoscopy. No alcohol.   No FH of colon CA Denies dark stools or blood in stools.  No night sweats. Losing weight - subjective feeling past month. Has not measured.  No fever.  Occasional frontal headache. No dizziness/lightheadedness.   Wt Readings from Last 3 Encounters:  07/27/19 136 lb 9.6 oz (62 kg)  07/13/19 136 lb (61.7 kg)  06/23/17 123 lb (55.8 kg)    Lab Results  Component Value Date   WBC 8.1 07/13/2019   HGB 9.1 (L) 07/13/2019   HCT 30.7 (L) 07/13/2019   MCV 70 (L) 07/13/2019   PLT 291 07/13/2019      Results for orders placed or performed in visit on 07/27/19  IFOBT POC (occult bld, rslt in office)  Result Value Ref Range   IFOBT Negative      History Patient Active Problem List   Diagnosis Date Noted  . Onychomycosis 05/30/2013  . Itching 05/30/2013  . History of  itching of eye 05/30/2013  . Peripheral neuropathy 05/30/2013   Past Medical History:  Diagnosis Date  . Allergy   . Varicose vein    No past surgical history on file. No Known Allergies Prior to Admission medications   Medication Sig Start Date End Date Taking? Authorizing Provider  azelastine (OPTIVAR) 0.05 % ophthalmic solution Place 1 drop into both eyes 2 (two) times daily. 07/13/19  Yes Wendie Agreste, MD  fluticasone (FLONASE) 50 MCG/ACT nasal spray Place 2 sprays into both nostrils daily. 07/13/19  Yes Wendie Agreste, MD  gabapentin (NEURONTIN) 100 MG capsule Take 1-2 capsules (100-200 mg total) by mouth at bedtime. 07/13/19  Yes Wendie Agreste, MD   Social History   Socioeconomic History  . Marital status: Married    Spouse name: Not on file  . Number of children: 14  . Years of education: Not on file  . Highest education level: Not on file  Occupational History  . Occupation: housekeeping    Comment: [employer]  Tobacco Use  . Smoking status: Former Smoker    Packs/day: 1.00    Years: 7.00    Pack years: 7.00    Quit date: 03/29/2013    Years since quitting: 6.3  . Smokeless tobacco: Never Used  Substance and Sexual Activity  . Alcohol use: No  Alcohol/week: 0.0 standard drinks  . Drug use: No  . Sexual activity: Yes    Birth control/protection: Post-menopausal  Other Topics Concern  . Not on file  Social History Narrative   Marital status: married; from [place]      Children: 14      Lives: with wife, 3 children.      Employment; housekeeping [employer]      Tobacco: quit in 2012; smoked x 6 years      Alcohol: none     Drugs: none   Social Determinants of Health   Financial Resource Strain:   . Difficulty of Paying Living Expenses:   Food Insecurity:   . Worried About Programme researcher, broadcasting/film/video in the Last Year:   . Barista in the Last Year:   Transportation Needs:   . Freight forwarder (Medical):   Marland Kitchen Lack of Transportation (Non-Medical):     Physical Activity:   . Days of Exercise per Week:   . Minutes of Exercise per Session:   Stress:   . Feeling of Stress :   Social Connections:   . Frequency of Communication with Friends and Family:   . Frequency of Social Gatherings with Friends and Family:   . Attends Religious Services:   . Active Member of Clubs or Organizations:   . Attends Banker Meetings:   Marland Kitchen Marital Status:   Intimate Partner Violence:   . Fear of Current or Ex-Partner:   . Emotionally Abused:   Marland Kitchen Physically Abused:   . Sexually Abused:     Review of Systems Per hpi.   Objective:   Vitals:   07/27/19 1359  BP: 122/75  Pulse: 73  Temp: 97.7 F (36.5 C)  SpO2: 100%  Weight: 136 lb 9.6 oz (62 kg)  Height: 5' 5.52" (1.664 m)     Physical Exam Vitals reviewed.  Constitutional:      General: He is not in acute distress.    Appearance: He is well-developed.  HENT:     Head: Normocephalic and atraumatic.  Eyes:     Pupils: Pupils are equal, round, and reactive to light.  Neck:     Vascular: No carotid bruit or JVD.  Cardiovascular:     Rate and Rhythm: Normal rate and regular rhythm.     Heart sounds: Normal heart sounds. No murmur.  Pulmonary:     Effort: Pulmonary effort is normal.     Breath sounds: Normal breath sounds. No rales.  Genitourinary:    Rectum: Normal. No mass, tenderness or external hemorrhoid. Normal anal tone.  Skin:    General: Skin is warm and dry.     Comments: No rash. No excoriation/wounds. Dry skin patches lower legs bilaterally. Min on arms.   Plantar feet slight dysesthesias.   Neurological:     General: No focal deficit present.     Mental Status: He is alert and oriented to person, place, and time.  Psychiatric:        Mood and Affect: Mood normal.        Behavior: Behavior normal.     Comments: Denies A/V/T hallucinations or delusions.  Does not appear to be responding to internal stimuli.     Results for orders placed or performed in  visit on 07/27/19  IFOBT POC (occult bld, rslt in office)  Result Value Ref Range   IFOBT Negative       Assessment & Plan:  Bob Scott is a 74  y.o. male . Microcytic anemia - Plan: Iron, IFOBT POC (occult bld, rslt in office), CBC, Ambulatory referral to Gastroenterology, IFOBT POC (occult bld, rslt in office)  -Repeat CBC with iron testing, refer to gastroenterology as overdue for colonoscopy, and with microcytic anemia, suspect iron deficiency.  Dysesthesia affecting both sides of body Dry skin  -Diffuse symptoms, potentially could have component of dry skin, versus allergic cause.  Trial of Claritin, dry skin care, recheck next few weeks.  -Option of 200 mg gabapentin for plantar dysesthesias, still likely related to lumbar spine.  Special screening for malignant neoplasms, colon - Plan: Ambulatory referral to Gastroenterology  -As above.  No orders of the defined types were placed in this encounter.  Patient Instructions   I will recheck blood counts but your test for anemia was low last visit.  You will need to have colonoscopy to make sure this is not coming from the intestines.  I placed a referral.  Try Claritin over-the-counter once per day to see if that helps with itching sensation on body.  Use Aveeno or Eucerin lotion for dry skin.  See other recommendations below.  Okay to take 2 gabapentin at bedtime if that helps for the symptoms.  Drink at least 64 ounces of water daily. Consider a humidifier for the room where you sleep. Bathe once daily. Avoid using HOT water, as it dries skin.  Avoid deodorant soaps (Dial is the worst!) and stick with gentle cleansers (I like Cetaphil Liquid Cleanser). After bathing, dry off completely, then apply a thick emollient cream (I like Cetaphil Moisturizing Cream, aveeno or eucerin) Apply the cream twice daily, or more!    If you have lab work done today you will be contacted with your lab results within the next 2 weeks.  If you  have not heard from [place] then please contact us. The fastest way to get your results is to register for My Chart.   IF you received an x-ray today, you will receive an invoice from [facility]. Please contact [facility] at [phone] with questions or concerns regarding your invoice.   IF you received labwork today, you will receive an invoice from [place]. Please contact [facility] at [phone] with questions or concerns regarding your invoice.   Our billing staff will not be able to assist you with questions regarding bills from these companies.  You will be contacted with the lab results as soon as they are available. The fastest way to get your results is to activate your My Chart account. Instructions are located on the last page of this paperwork. If you have not heard from [place] regarding the results in 2 weeks, please contact this office.         Signed, Meredith Staggers, MD [facility]

## 2019-07-28 LAB — CBC
Hematocrit: 29.8 % — ABNORMAL LOW (ref 37.5–51.0)
Hemoglobin: 8.4 g/dL — ABNORMAL LOW (ref 13.0–17.7)
MCH: 19.5 pg — ABNORMAL LOW (ref 26.6–33.0)
MCHC: 28.2 g/dL — ABNORMAL LOW (ref 31.5–35.7)
MCV: 69 fL — ABNORMAL LOW (ref 79–97)
Platelets: 253 10*3/uL (ref 150–450)
RBC: 4.3 x10E6/uL (ref 4.14–5.80)
RDW: 18 % — ABNORMAL HIGH (ref 11.6–15.4)
WBC: 7.5 10*3/uL (ref 3.4–10.8)

## 2019-07-28 LAB — IRON: Iron: 12 ug/dL — ABNORMAL LOW (ref 38–169)

## 2019-08-04 NOTE — Progress Notes (Signed)
Called pt, lm for him to call bk regarding lab

## 2019-08-27 ENCOUNTER — Encounter: Payer: Self-pay | Admitting: Physician Assistant

## 2019-09-18 ENCOUNTER — Ambulatory Visit: Payer: Medicare Other | Admitting: Physician Assistant

## 2020-03-16 ENCOUNTER — Ambulatory Visit (INDEPENDENT_AMBULATORY_CARE_PROVIDER_SITE_OTHER): Payer: BC Managed Care – PPO | Admitting: Registered Nurse

## 2020-03-16 ENCOUNTER — Encounter: Payer: Self-pay | Admitting: Registered Nurse

## 2020-03-16 ENCOUNTER — Other Ambulatory Visit: Payer: Self-pay

## 2020-03-16 VITALS — BP 138/71 | HR 78 | Temp 98.1°F | Resp 18 | Ht 65.5 in | Wt 131.8 lb

## 2020-03-16 DIAGNOSIS — L853 Xerosis cutis: Secondary | ICD-10-CM | POA: Diagnosis not present

## 2020-03-16 DIAGNOSIS — L299 Pruritus, unspecified: Secondary | ICD-10-CM | POA: Diagnosis not present

## 2020-03-16 DIAGNOSIS — D509 Iron deficiency anemia, unspecified: Secondary | ICD-10-CM | POA: Diagnosis not present

## 2020-03-16 DIAGNOSIS — R203 Hyperesthesia: Secondary | ICD-10-CM

## 2020-03-16 NOTE — Patient Instructions (Signed)
° ° ° °  If you have lab work done today you will be contacted with your lab results within the next 2 weeks.  If you have not heard from us then please contact us. The fastest way to get your results is to register for My Chart. ° ° °IF you received an x-ray today, you will receive an invoice from [facility]. Please contact  Radiology at [phone] with questions or concerns regarding your invoice.  ° °IF you received labwork today, you will receive an invoice from [facility]. Please contact [facility] at [phone] with questions or concerns regarding your invoice.  ° °Our billing staff will not be able to assist you with questions regarding bills from these companies. ° °You will be contacted with the lab results as soon as they are available. The fastest way to get your results is to activate your My Chart account. Instructions are located on the last page of this paperwork. If you have not heard from us regarding the results in 2 weeks, please contact this office. °  ° ° ° °

## 2020-03-16 NOTE — Patient Instructions (Signed)
° ° ° °  If you have lab work done today you will be contacted with your lab results within the next 2 weeks.  If you have not heard from us then please contact us. The fastest way to get your results is to register for My Chart. ° ° °IF you received an x-ray today, you will receive an invoice from [facility]. Please contact [facility] at [phone] with questions or concerns regarding your invoice.  ° °IF you received labwork today, you will receive an invoice from [facility]. Please contact [facility] at [phone] with questions or concerns regarding your invoice.  ° °Our billing staff will not be able to assist you with questions regarding bills from these companies. ° °You will be contacted with the lab results as soon as they are available. The fastest way to get your results is to activate your My Chart account. Instructions are located on the last page of this paperwork. If you have not heard from us regarding the results in 2 weeks, please contact this office. °  ° ° ° °

## 2020-03-17 ENCOUNTER — Encounter: Payer: Self-pay | Admitting: Registered Nurse

## 2020-03-17 LAB — CBC WITH DIFFERENTIAL
Basophils Absolute: 0.1 10*3/uL (ref 0.0–0.2)
Basos: 1 %
EOS (ABSOLUTE): 0.1 10*3/uL (ref 0.0–0.4)
Eos: 2 %
Hematocrit: 29.1 % — ABNORMAL LOW (ref 37.5–51.0)
Hemoglobin: 8 g/dL — ABNORMAL LOW (ref 13.0–17.7)
Immature Grans (Abs): 0 10*3/uL (ref 0.0–0.1)
Immature Granulocytes: 0 %
Lymphocytes Absolute: 2.6 10*3/uL (ref 0.7–3.1)
Lymphs: 46 %
MCH: 17.8 pg — ABNORMAL LOW (ref 26.6–33.0)
MCHC: 27.5 g/dL — ABNORMAL LOW (ref 31.5–35.7)
MCV: 65 fL — ABNORMAL LOW (ref 79–97)
Monocytes Absolute: 0.6 10*3/uL (ref 0.1–0.9)
Monocytes: 10 %
Neutrophils Absolute: 2.3 10*3/uL (ref 1.4–7.0)
Neutrophils: 41 %
RBC: 4.5 x10E6/uL (ref 4.14–5.80)
RDW: 19.9 % — ABNORMAL HIGH (ref 11.6–15.4)
WBC: 5.7 10*3/uL (ref 3.4–10.8)

## 2020-03-17 LAB — COMPREHENSIVE METABOLIC PANEL
ALT: 9 IU/L (ref 0–44)
AST: 24 IU/L (ref 0–40)
Albumin/Globulin Ratio: 1.2 (ref 1.2–2.2)
Albumin: 4.5 g/dL (ref 3.7–4.7)
Alkaline Phosphatase: 78 IU/L (ref 44–121)
BUN/Creatinine Ratio: 11 (ref 10–24)
BUN: 9 mg/dL (ref 8–27)
Bilirubin Total: 0.2 mg/dL (ref 0.0–1.2)
CO2: 25 mmol/L (ref 20–29)
Calcium: 9.3 mg/dL (ref 8.6–10.2)
Chloride: 102 mmol/L (ref 96–106)
Creatinine, Ser: 0.83 mg/dL (ref 0.76–1.27)
GFR calc Af Amer: 100 mL/min/{1.73_m2} (ref >59)
GFR calc non Af Amer: 87 mL/min/{1.73_m2} (ref >59)
Globulin, Total: 3.8 g/dL (ref 1.5–4.5)
Glucose: 57 mg/dL — ABNORMAL LOW (ref 65–99)
Potassium: 4.3 mmol/L (ref 3.5–5.2)
Sodium: 141 mmol/L (ref 134–144)
Total Protein: 8.3 g/dL (ref 6.0–8.5)

## 2020-03-17 LAB — TSH: TSH: 1.59 u[IU]/mL (ref 0.450–4.500)

## 2020-03-17 LAB — PATHOLOGIST SMEAR REVIEW
EOS (ABSOLUTE): 0.1 10*3/uL (ref 0.0–0.4)
MCH: 17.5 pg — ABNORMAL LOW (ref 26.6–33.0)
MCV: 64 fL — ABNORMAL LOW (ref 79–97)
Monocytes: 10 %
Platelets: 198 10*3/uL (ref 150–450)

## 2020-03-17 LAB — VITAMIN B12: Vitamin B-12: 634 pg/mL (ref 232–1245)

## 2020-03-17 NOTE — Progress Notes (Signed)
Acute Office Visit  Subjective:    Patient ID: Bob Scott, male    DOB: [birth year], 74 y.o.   MRN: [id number]  Chief Complaint  Patient presents with  . Follow-up    patient is here to establish care. Per patient he feels like he has like something has been pinching his skin and feels like something is crawling. Per patient he does not see anything and never know when he feels the opinching but it seems to comes and goes.    HPI Patient is in today for itching  Has been ongoing for quite some time - last seen by Dr. Neva Seat around 6 mos ago who ran some labs and requested that he return for follow up, unfortunately pt did not  Labs noted a microcytic anemia with RDW elevated to 18+.  No noted b12 deficiency  Pt denies pica  Pt denies rash, no excoriation, itching seems to be worse at night but lasts all day Has tried some antihistamines with no perceptible change in symptoms. Has not seen dermatology or other specialist for this Reviewed medical history with patient - unfortunately no cause emerges beyond hx of anemia. Does work as a Advertising copywriter but states there are no exposures to chemicals or other products that would concern him  Past Medical History:  Diagnosis Date  . Allergy   . Varicose vein     No past surgical history on file.  No family history on file.  Social History   Socioeconomic History  . Marital status: Married    Spouse name: Not on file  . Number of children: 14  . Years of education: Not on file  . Highest education level: Not on file  Occupational History  . Occupation: housekeeping    Comment: [employer]  Tobacco Use  . Smoking status: Current Some Day Smoker  . Smokeless tobacco: Never Used  Substance and Sexual Activity  . Alcohol use: Not Currently  . Drug use: Never  . Sexual activity: Not Currently    Birth control/protection: Post-menopausal  Other Topics Concern  . Not on file  Social History Narrative   ** Merged History  Encounter **       Marital status: married; from [place]    Children: 14    Lives: with wife, 3 children.    Employment; housekeeping [employer]    Tobacco: quit in 2012; smoked x 6 years    Alcohol: none   Drugs: none   Social Determinants of Corporate investment banker Strain: Not on file  Food Insecurity: Not on file  Transportation Needs: Not on file  Physical Activity: Not on file  Stress: Not on file  Social Connections: Not on file  Intimate Partner Violence: Not on file    Outpatient Medications Prior to Visit  Medication Sig Dispense Refill  . azelastine (OPTIVAR) 0.05 % ophthalmic solution Place 1 drop into both eyes 2 (two) times daily. 6 mL 2  . fluticasone (FLONASE) 50 MCG/ACT nasal spray Place 2 sprays into both nostrils daily. 16 g 6  . gabapentin (NEURONTIN) 100 MG capsule Take 1-2 capsules (100-200 mg total) by mouth at bedtime. 60 capsule 1   No facility-administered medications prior to visit.    No Known Allergies  Review of Systems  Constitutional: Negative.   HENT: Negative.   Eyes: Negative.   Respiratory: Negative.   Cardiovascular: Negative.   Gastrointestinal: Negative.   Genitourinary: Negative.   Musculoskeletal: Negative.   Skin: Negative.   Neurological: Negative.  Psychiatric/Behavioral: Negative.        Objective:    Physical Exam Vitals and nursing note reviewed.  Constitutional:      Appearance: Normal appearance.  Cardiovascular:     Rate and Rhythm: Normal rate and regular rhythm.     Pulses: Normal pulses.     Heart sounds: Normal heart sounds.  Pulmonary:     Effort: Pulmonary effort is normal. No respiratory distress.     Breath sounds: Normal breath sounds.  Musculoskeletal:        General: No swelling or tenderness. Normal range of motion.  Skin:    General: Skin is warm and dry.     Capillary Refill: Capillary refill takes less than 2 seconds.     Coloration: Skin is not jaundiced or pale.     Findings: No  bruising, erythema, lesion or rash.  Neurological:     General: No focal deficit present.     Mental Status: He is alert and oriented to person, place, and time. Mental status is at baseline.  Psychiatric:        Mood and Affect: Mood normal.        Behavior: Behavior normal.        Thought Content: Thought content normal.        Judgment: Judgment normal.     BP 138/71   Pulse 78   Temp 98.1 F (36.7 C) (Temporal)   Resp 18   Ht 5' 5.5" (1.664 m)   Wt 131 lb 12.8 oz (59.8 kg)   SpO2 96%   BMI 21.60 kg/m  Wt Readings from Last 3 Encounters:  03/16/20 131 lb 12.8 oz (59.8 kg)  03/16/20 131 lb 12.8 oz (59.8 kg)  07/27/19 136 lb 9.6 oz (62 kg)    Health Maintenance Due  Topic Date Due  . COVID-19 Vaccine (3 - Booster for Pfizer series) 01/07/2020    There are no preventive care reminders to display for this patient.   Lab Results  Component Value Date   TSH 1.590 03/16/2020   Lab Results  Component Value Date   WBC 5.9 03/16/2020   HGB 8.0 (L) 03/16/2020   HCT 29.3 (L) 03/16/2020   MCV 64 (L) 03/16/2020   PLT 198 03/16/2020   Lab Results  Component Value Date   NA 141 03/16/2020   K 4.3 03/16/2020   CO2 25 03/16/2020   GLUCOSE 57 (L) 03/16/2020   BUN 9 03/16/2020   CREATININE 0.83 03/16/2020   BILITOT 0.2 03/16/2020   ALKPHOS 78 03/16/2020   AST 24 03/16/2020   ALT 9 03/16/2020   PROT 8.3 03/16/2020   ALBUMIN 4.5 03/16/2020   CALCIUM 9.3 03/16/2020   No results found for: CHOL No results found for: HDL No results found for: LDLCALC No results found for: TRIG No results found for: CHOLHDL No results found for: HTDS2A     Assessment & Plan:   Problem List Items Addressed This Visit   None   Visit Diagnoses    Microcytic anemia    -  Primary   Relevant Orders   CBC With Differential (Completed)   Comprehensive metabolic panel (Completed)   TSH (Completed)   Vitamin B12 (Completed)   Pathologist smear review (Completed)   Dry skin        Relevant Orders   CBC With Differential (Completed)   Comprehensive metabolic panel (Completed)   TSH (Completed)   Vitamin B12 (Completed)   Pathologist smear review (Completed)  Generalized pruritus       Relevant Orders   CBC With Differential (Completed)   Comprehensive metabolic panel (Completed)   TSH (Completed)   Vitamin B12 (Completed)   Pathologist smear review (Completed)   Hyperesthesia        Relevant Orders   Vitamin B12 (Completed)       No orders of the defined types were placed in this encounter.  PLAN  Exam unremarkable - noted yellowish tint to palmar surfaces of patient's hands - pt states this is baseline and has not changed - past labs show no hepatic or apparent gallbladder dysfunction  Will collect labs. Unclear etiology. May be manifestation of anemia - will draw iron studies to investigate iron deficiency vs other causes. B12 collected as well. Suggest pt to start daily otc multivitamin with iron and folate if not already taking. Will follow up based on lab results  Patient encouraged to call clinic with any questions, comments, or concerns.   Janeece Agee, NP

## 2020-03-18 LAB — PATHOLOGIST SMEAR REVIEW
Basophils Absolute: 0.1 10*3/uL (ref 0.0–0.2)
Basos: 1 %
Eos: 2 %
Hematocrit: 29.3 % — ABNORMAL LOW (ref 37.5–51.0)
Hemoglobin: 8 g/dL — ABNORMAL LOW (ref 13.0–17.7)
Immature Grans (Abs): 0 10*3/uL (ref 0.0–0.1)
Immature Granulocytes: 0 %
Lymphocytes Absolute: 2.6 10*3/uL (ref 0.7–3.1)
Lymphs: 44 %
MCHC: 27.3 g/dL — ABNORMAL LOW (ref 31.5–35.7)
Monocytes Absolute: 0.6 10*3/uL (ref 0.1–0.9)
Neutrophils Absolute: 2.5 10*3/uL (ref 1.4–7.0)
Neutrophils: 43 %
Path Rev PLTs: NORMAL
RBC: 4.56 x10E6/uL (ref 4.14–5.80)
RDW: 20.1 % — ABNORMAL HIGH (ref 11.6–15.4)
WBC: 5.9 10*3/uL (ref 3.4–10.8)

## 2020-11-22 ENCOUNTER — Ambulatory Visit: Payer: Medicare Other | Attending: Family Medicine | Admitting: Family Medicine

## 2020-11-22 ENCOUNTER — Encounter: Payer: Self-pay | Admitting: Family Medicine

## 2020-11-22 ENCOUNTER — Other Ambulatory Visit: Payer: Self-pay

## 2020-11-22 DIAGNOSIS — M2550 Pain in unspecified joint: Secondary | ICD-10-CM | POA: Diagnosis not present

## 2020-11-22 DIAGNOSIS — R7989 Other specified abnormal findings of blood chemistry: Secondary | ICD-10-CM

## 2020-11-22 DIAGNOSIS — G6289 Other specified polyneuropathies: Secondary | ICD-10-CM

## 2020-11-22 DIAGNOSIS — M542 Cervicalgia: Secondary | ICD-10-CM

## 2020-11-22 DIAGNOSIS — R350 Frequency of micturition: Secondary | ICD-10-CM

## 2020-11-22 DIAGNOSIS — N401 Enlarged prostate with lower urinary tract symptoms: Secondary | ICD-10-CM

## 2020-11-22 DIAGNOSIS — D509 Iron deficiency anemia, unspecified: Secondary | ICD-10-CM

## 2020-11-22 MED ORDER — DULOXETINE HCL 30 MG PO CPEP: 30.0000 mg | ORAL_CAPSULE | Freq: Every day | ORAL | 3 refills | Status: DC

## 2020-11-22 MED ORDER — MELOXICAM 7.5 MG PO TABS: 7.5000 mg | ORAL_TABLET | Freq: Every day | ORAL | 3 refills | Status: DC

## 2020-11-22 MED ORDER — TAMSULOSIN HCL 0.4 MG PO CAPS: 0.4000 mg | ORAL_CAPSULE | Freq: Every day | ORAL | 3 refills | Status: AC

## 2020-11-22 NOTE — Progress Notes (Signed)
Virtual Visit via Telephone Note  I connected with [name], on 11/22/2020 at 10:02 AM by telephone due to the COVID-19 pandemic and verified that I am speaking with the correct person using two identifiers.   Consent: I discussed the limitations, risks, security and privacy concerns of performing an evaluation and management service by telephone and the availability of in person appointments. I also discussed with the patient that there may be a patient responsible charge related to this service. The patient expressed understanding and agreed to proceed.   Location of Patient: Home  Location of Provider: Clinic   Persons participating in Telemedicine visit: Erline Levine Dr. Margarita Rana     History of Present Illness: Bob Scott is a 75 y.o. year old male with a history of anemia here to establish care.  He complains of pain in his neck and all his joints, also has pain in his feet and he uses Ibuprofen. Pain is unrelated to the weather and is present all the time. He has limited range of motion in his neck. Neck symptoms have been present for 2 months but arthralgias have been on for several months. He also has paresthesia in his feet.  Med list reveals he should be on gabapentin which he last received in 07/2019 however he states he no longer takes it. He complains of urinary urgency which he has had for a while. He has no hesitancy or straining.   Past Medical History:  Diagnosis Date   Allergy    Varicose vein    No Known Allergies  Current Outpatient Medications on File Prior to Visit  Medication Sig Dispense Refill   azelastine (OPTIVAR) 0.05 % ophthalmic solution Place 1 drop into both eyes 2 (two) times daily. 6 mL 2   fluticasone (FLONASE) 50 MCG/ACT nasal spray Place 2 sprays into both nostrils daily. 16 g 6   gabapentin (NEURONTIN) 100 MG capsule Take 1-2 capsules (100-200 mg total) by mouth at bedtime. 60 capsule 1   No current facility-administered medications  on file prior to visit.    ROS: See HPI  Observations/Objective: Awake, alert, oriented x3 Not in acute distress Normal mood  CMP Latest Ref Rng & Units 03/16/2020 07/13/2019 02/25/2015  Glucose 65 - 99 mg/dL 57(L) 77 78  BUN 8 - 27 mg/dL '9 8 10  ' Creatinine 0.76 - 1.27 mg/dL 0.83 0.81 0.92  Sodium 134 - 144 mmol/L 141 140 141  Potassium 3.5 - 5.2 mmol/L 4.3 4.5 4.0  Chloride 96 - 106 mmol/L 102 102 107  CO2 20 - 29 mmol/L '25 24 26  ' Calcium 8.6 - 10.2 mg/dL 9.3 9.4 9.3  Total Protein 6.0 - 8.5 g/dL 8.3 8.2 7.7  Total Bilirubin 0.0 - 1.2 mg/dL 0.2 0.2 0.4  Alkaline Phos 44 - 121 IU/L 78 90 63  AST 0 - 40 IU/L '24 22 18  ' ALT 0 - 44 IU/L '9 11 15    ' Lipid Panel  No results found for: CHOL, TRIG, HDL, CHOLHDL, VLDL, LDLCALC, LDLDIRECT, LABVLDL  No results found for: HGBA1C  Assessment and Plan: 1. Cervicalgia Uncontrolled Meloxicam and Cymbalta added to regimen - meloxicam (MOBIC) 7.5 MG tablet; Take 1 tablet (7.5 mg total) by mouth daily.  Dispense: 30 tablet; Refill: 3 - Ambulatory referral to Physical Therapy  2. Arthralgia, unspecified joint Could be secondary to osteoarthritis Placed on meloxicam  3. Other polyneuropathy Will need to screen for diabetes - DULoxetine (CYMBALTA) 30 MG capsule; Take 1 capsule (30 mg total)  by mouth daily.  Dispense: 30 capsule; Refill: 3 - CMP14+EGFR; Future - Hemoglobin A1c; Future  4. Iron deficiency anemia, unspecified iron deficiency anemia type - CBC with Differential/Platelet; Future - Iron, TIBC and Ferritin Panel; Future - Transferrin; Future  5. Benign prostatic hyperplasia with urinary frequency - tamsulosin (FLOMAX) 0.4 MG CAPS capsule; Take 1 capsule (0.4 mg total) by mouth daily.  Dispense: 30 capsule; Refill: 3 - PSA, total and free; Future  6. Other specified abnormal findings of blood chemistry  - Hemoglobin A1c; Future   Follow Up Instructions: 3 months   I discussed the assessment and treatment plan with the  patient. The patient was provided an opportunity to ask questions and all were answered. The patient agreed with the plan and demonstrated an understanding of the instructions.   The patient was advised to call back or seek an in-person evaluation if the symptoms worsen or if the condition fails to improve as anticipated.     I provided 14 minutes total of non-face-to-face time during this encounter.   Charlott Rakes, MD, FAAFP. [facility] and [facility], [place]   11/22/2020, 10:02 AM

## 2020-12-13 ENCOUNTER — Other Ambulatory Visit: Payer: Self-pay

## 2020-12-13 ENCOUNTER — Ambulatory Visit: Payer: Medicare Other | Attending: Family Medicine

## 2020-12-13 DIAGNOSIS — R7989 Other specified abnormal findings of blood chemistry: Secondary | ICD-10-CM

## 2020-12-13 DIAGNOSIS — G6289 Other specified polyneuropathies: Secondary | ICD-10-CM

## 2020-12-13 DIAGNOSIS — N401 Enlarged prostate with lower urinary tract symptoms: Secondary | ICD-10-CM

## 2020-12-13 DIAGNOSIS — D509 Iron deficiency anemia, unspecified: Secondary | ICD-10-CM

## 2020-12-14 ENCOUNTER — Telehealth: Payer: Self-pay

## 2020-12-14 ENCOUNTER — Other Ambulatory Visit: Payer: Self-pay | Admitting: Family Medicine

## 2020-12-14 ENCOUNTER — Telehealth: Payer: Self-pay | Admitting: Hematology and Oncology

## 2020-12-14 DIAGNOSIS — G6289 Other specified polyneuropathies: Secondary | ICD-10-CM

## 2020-12-14 DIAGNOSIS — D509 Iron deficiency anemia, unspecified: Secondary | ICD-10-CM

## 2020-12-14 LAB — PSA, TOTAL AND FREE
PSA, Free Pct: 27.1 %
PSA, Free: 0.19 ng/mL
Prostate Specific Ag, Serum: 0.7 ng/mL (ref 0.0–4.0)

## 2020-12-14 LAB — CBC WITH DIFFERENTIAL/PLATELET
Basophils Absolute: 0.1 10*3/uL (ref 0.0–0.2)
Basos: 1 %
EOS (ABSOLUTE): 0.1 10*3/uL (ref 0.0–0.4)
Eos: 1 %
Hematocrit: 25.5 % — ABNORMAL LOW (ref 37.5–51.0)
Hemoglobin: 7 g/dL — CL (ref 13.0–17.7)
Immature Grans (Abs): 0 10*3/uL (ref 0.0–0.1)
Immature Granulocytes: 0 %
Lymphocytes Absolute: 3.1 10*3/uL (ref 0.7–3.1)
Lymphs: 43 %
MCH: 17.1 pg — ABNORMAL LOW (ref 26.6–33.0)
MCHC: 27.5 g/dL — ABNORMAL LOW (ref 31.5–35.7)
MCV: 62 fL — ABNORMAL LOW (ref 79–97)
Monocytes Absolute: 0.7 10*3/uL (ref 0.1–0.9)
Monocytes: 10 %
Neutrophils Absolute: 3.2 10*3/uL (ref 1.4–7.0)
Neutrophils: 45 %
Platelets: 160 10*3/uL (ref 150–450)
RBC: 4.1 x10E6/uL — ABNORMAL LOW (ref 4.14–5.80)
RDW: 22.8 % — ABNORMAL HIGH (ref 11.6–15.4)
WBC: 7 10*3/uL (ref 3.4–10.8)

## 2020-12-14 LAB — CMP14+EGFR
ALT: 20 IU/L (ref 0–44)
AST: 37 IU/L (ref 0–40)
Albumin/Globulin Ratio: 1.5 (ref 1.2–2.2)
Albumin: 4.5 g/dL (ref 3.7–4.7)
Alkaline Phosphatase: 71 IU/L (ref 44–121)
BUN/Creatinine Ratio: 10 (ref 10–24)
BUN: 11 mg/dL (ref 8–27)
Bilirubin Total: 0.3 mg/dL (ref 0.0–1.2)
CO2: 22 mmol/L (ref 20–29)
Calcium: 9.5 mg/dL (ref 8.6–10.2)
Chloride: 104 mmol/L (ref 96–106)
Creatinine, Ser: 1.11 mg/dL (ref 0.76–1.27)
Globulin, Total: 3 g/dL (ref 1.5–4.5)
Glucose: 155 mg/dL — ABNORMAL HIGH (ref 65–99)
Potassium: 4.2 mmol/L (ref 3.5–5.2)
Sodium: 143 mmol/L (ref 134–144)
Total Protein: 7.5 g/dL (ref 6.0–8.5)
eGFR: 69 mL/min/{1.73_m2} (ref >59)

## 2020-12-14 LAB — TRANSFERRIN: Transferrin: 415 mg/dL — ABNORMAL HIGH (ref 177–329)

## 2020-12-14 LAB — IRON,TIBC AND FERRITIN PANEL
Ferritin: 12 ng/mL — ABNORMAL LOW (ref 30–400)
Iron Saturation: 2 % — CL (ref 15–55)
Iron: 10 ug/dL — ABNORMAL LOW (ref 38–169)
Total Iron Binding Capacity: 481 ug/dL — ABNORMAL HIGH (ref 250–450)
UIBC: 471 ug/dL — ABNORMAL HIGH (ref 111–343)

## 2020-12-14 LAB — HEMOGLOBIN A1C
Est. average glucose Bld gHb Est-mCnc: 117 mg/dL
Hgb A1c MFr Bld: 5.7 % — ABNORMAL HIGH (ref 4.8–5.6)

## 2020-12-14 MED ORDER — IRON (FERROUS SULFATE) 325 (65 FE) MG PO TABS: 325.0000 mg | ORAL_TABLET | Freq: Every day | ORAL | 3 refills | Status: AC

## 2020-12-14 NOTE — Telephone Encounter (Signed)
-----   Message from Hoy Register, MD sent at 12/14/2020  1:48 PM EDT ----- Please inform him that his labs are negative for diabetes.  Screening for prostate cancer is negative, liver and kidney functions are normal.  He is anemic which has worsened compared to previous labs and I have referred him to a hematologist for further evaluation and also to gastroenterology for colonoscopy.  I have sent iron tablets to his pharmacy.  Please advised him that if he feels lightheaded or weak he will need to go to the emergency room.

## 2020-12-14 NOTE — Telephone Encounter (Signed)
Call placed to patient and call dropped.  Please advise patient of lab results when he returns call.

## 2020-12-14 NOTE — Telephone Encounter (Signed)
Scheduled appt per 9/7 referral. Pt is aware of appt date and time. Per pt request I also mailed updated calendar.

## 2020-12-15 NOTE — Telephone Encounter (Signed)
Requested medications are due for refill today.  A bit too soon  Requested medications are on the active medications list.  yes  Last refill. 11/22/2020 #30/3 refill  Future visit scheduled.   no  Notes to clinic.  No PCP per chart. Saw this pt 11/22/2020.

## 2020-12-16 ENCOUNTER — Telehealth: Payer: Self-pay

## 2020-12-16 NOTE — Telephone Encounter (Signed)
-----   Message from Enobong Newlin, MD sent at 12/14/2020  1:48 PM EDT ----- Please inform him that his labs are negative for diabetes.  Screening for prostate cancer is negative, liver and kidney functions are normal.  He is anemic which has worsened compared to previous labs and I have referred him to a hematologist for further evaluation and also to gastroenterology for colonoscopy.  I have sent iron tablets to his pharmacy.  Please advised him that if he feels lightheaded or weak he will need to go to the emergency room. 

## 2020-12-16 NOTE — Telephone Encounter (Signed)
Patient name and DOB has been verified Patient was informed of lab results. Patient had no questions.  

## 2020-12-26 ENCOUNTER — Inpatient Hospital Stay: Payer: BC Managed Care – PPO | Attending: Hematology and Oncology | Admitting: Hematology and Oncology

## 2020-12-26 DIAGNOSIS — D509 Iron deficiency anemia, unspecified: Secondary | ICD-10-CM | POA: Insufficient documentation

## 2020-12-26 NOTE — Assessment & Plan Note (Deleted)
Lab review: 12/13/2020: Hemoglobin 7, MCV 62, RDW 22.8, CMP normal, TIBC 471, iron saturation 2%, ferritin 12 (7 years ago the ferritin was 8)  Iron deficiency anemia: I discussed with the patient the process of iron absorption. I counseled extensively regarding the different causes of iron deficiency including blood loss and malabsorption. Patient has had upper endoscopies and colonoscopies and did not have any clear identified source of blood loss. It is possible that the patient may still have an occult source of bleeding. However malabsorption is also a possibility.   Recommendation: 1. Stop oral iron 2. proceed with IV iron infusions  I discussed with the patient that potentially there may be a need for additional IV iron infusions if the iron levels were to remain low in the future. The frequency of need of IV iron would depend on many other factors including the rate of loss and by the degree of absorption.  Return to clinic in 3 months with recheck on iron studies and hemoglobin.

## 2020-12-30 ENCOUNTER — Ambulatory Visit: Payer: Medicare Other

## 2020-12-30 ENCOUNTER — Telehealth: Payer: Self-pay | Admitting: Hematology and Oncology

## 2020-12-30 NOTE — Telephone Encounter (Signed)
R/s pt's missed appt with Dr. Pamelia Hoit. Pt stated that he forgot about appt. R/s appt to first week in October per pt request. Pt requested I mail a letter with out address and appt information. Letter was put in the mail today.

## 2021-01-10 NOTE — Progress Notes (Signed)
[facility] CONSULT NOTE  Patient Care Team: Pcp, No as PCP - General  CHIEF COMPLAINTS/PURPOSE OF CONSULTATION:  Newly diagnosed IDA  HISTORY OF PRESENTING ILLNESS:  Bob Scott 75 y.o. male is here because of recent diagnosis of IDA. Labs on 12/13/2020 showed Hg 7, HCT 25.5, MCV 62, Iron sat 2%, ferritin 12. He presents to the clinic today for initial evaluation and discussion of treatment options.  He reports that over the past 2 to 3 months he has been getting more more fatigued.  He is also feeling slightly lightheaded and dizzy.  He has not noticed any blood in the stool.  He works in Stage manager at [employer].  He was originally from [place].  I reviewed his records extensively and collaborated the history with the patient.  MEDICAL HISTORY:  Past Medical History:  Diagnosis Date   Allergy    Varicose vein     SURGICAL HISTORY: No past surgical history on file.  SOCIAL HISTORY: Social History   Socioeconomic History   Marital status: Married    Spouse name: Not on file   Number of children: 14   Years of education: Not on file   Highest education level: Not on file  Occupational History   Occupation: housekeeping    Comment: [employer]  Tobacco Use   Smoking status: Some Days   Smokeless tobacco: Never  Substance and Sexual Activity   Alcohol use: Not Currently   Drug use: Never   Sexual activity: Not Currently    Birth control/protection: Post-menopausal  Other Topics Concern   Not on file  Social History Narrative   ** Merged History Encounter **       Marital status: married; from [place]    Children: 14    Lives: with wife, 3 children.    Employment; housekeeping [employer]    Tobacco: quit in 2012; smoked x 6 years    Alcohol: none   Drugs: none   Social Determinants of Corporate investment banker Strain: Not on file  Food Insecurity: Not on file  Transportation Needs: Not on file  Physical Activity: Not on file  Stress: Not on file  Social  Connections: Not on file  Intimate Partner Violence: Not on file    FAMILY HISTORY: No family history on file.  ALLERGIES:  has No Known Allergies.  MEDICATIONS:  Current Outpatient Medications  Medication Sig Dispense Refill   azelastine (OPTIVAR) 0.05 % ophthalmic solution Place 1 drop into both eyes 2 (two) times daily. 6 mL 2   DULoxetine (CYMBALTA) 30 MG capsule TAKE 1 CAPSULE BY MOUTH EVERY DAY 90 capsule 1   fluticasone (FLONASE) 50 MCG/ACT nasal spray Place 2 sprays into both nostrils daily. 16 g 6   gabapentin (NEURONTIN) 100 MG capsule Take 1-2 capsules (100-200 mg total) by mouth at bedtime. 60 capsule 1   Iron, Ferrous Sulfate, 325 (65 Fe) MG TABS Take 325 mg by mouth daily. 60 tablet 3   meloxicam (MOBIC) 7.5 MG tablet Take 1 tablet (7.5 mg total) by mouth daily. 30 tablet 3   tamsulosin (FLOMAX) 0.4 MG CAPS capsule Take 1 capsule (0.4 mg total) by mouth daily. 30 capsule 3   No current facility-administered medications for this visit.    REVIEW OF SYSTEMS:   Constitutional: Denies fevers, chills or abnormal night sweats Eyes: Denies blurriness of vision, double vision or watery eyes Ears, nose, mouth, throat, and face: Denies mucositis or sore throat Respiratory: Shortness of breath to exertion Cardiovascular:  Denies palpitation, chest discomfort or lower extremity swelling Gastrointestinal:  Denies nausea, heartburn or change in bowel habits Skin: Denies abnormal skin rashes Lymphatics: Denies new lymphadenopathy or easy bruising Neurological:Denies numbness, tingling or new weaknesses Behavioral/Psych: Mood is stable, no new changes  All other systems were reviewed with the patient and are negative.  PHYSICAL EXAMINATION: ECOG PERFORMANCE STATUS: 1 - Symptomatic but completely ambulatory  Vitals:   01/11/21 1537  BP: 133/71  Pulse: 64  Resp: 18  Temp: (!) 97.4 F (36.3 C)  SpO2: 100%   Filed Weights   01/11/21 1537  Weight: 122 lb 9.6 oz (55.6 kg)     GENERAL:alert, no distress and comfortable SKIN: skin color, texture, turgor are normal, no rashes or significant lesions EYES: normal, conjunctiva are pink and non-injected, sclera clear OROPHARYNX:no exudate, no erythema and lips, buccal mucosa, and tongue normal  NECK: supple, thyroid normal size, non-tender, without nodularity LYMPH:  no palpable lymphadenopathy in the cervical, axillary or inguinal LUNGS: clear to auscultation and percussion with normal breathing effort HEART: regular rate & rhythm and no murmurs and no lower extremity edema ABDOMEN:abdomen soft, non-tender and normal bowel sounds Musculoskeletal:no cyanosis of digits and no clubbing  PSYCH: alert & oriented x 3 with fluent speech NEURO: no focal motor/sensory deficits  LABORATORY DATA:  I have reviewed the data as listed Lab Results  Component Value Date   WBC 7.0 12/13/2020   HGB 7.0 (LL) 12/13/2020   HCT 25.5 (L) 12/13/2020   MCV 62 (L) 12/13/2020   PLT 160 12/13/2020   Lab Results  Component Value Date   NA 143 12/13/2020   K 4.2 12/13/2020   CL 104 12/13/2020   CO2 22 12/13/2020    RADIOGRAPHIC STUDIES: I have personally reviewed the radiological reports and agreed with the findings in the report.  ASSESSMENT AND PLAN:  Iron deficiency anemia Unclear etiology.  He has not noticed any blood in the stool Lab review: 05/30/2013: Hemoglobin 11.6, MCV 77.3 07/13/2019: Hemoglobin 9.1, MCV 70 12/13/2020: Hemoglobin 7, MCV 62, iron saturation 2%, ferritin 12, TIBC 481  Recommendation: 3 doses of IV Venofer Referral to gastroenterology for colonoscopy/endoscopy  Return to clinic in 3 months with labs and follow-up   All questions were answered. The patient knows to call the clinic with any problems, questions or concerns.   Sabas Sous, MD, MPH 01/11/2021    I, Alda Ponder, am acting as scribe for Serena Croissant, MD.  I have reviewed the above documentation for accuracy and completeness, and  I agree with the above.

## 2021-01-11 ENCOUNTER — Inpatient Hospital Stay: Payer: BC Managed Care – PPO | Attending: Hematology and Oncology | Admitting: Hematology and Oncology

## 2021-01-11 ENCOUNTER — Other Ambulatory Visit: Payer: Self-pay

## 2021-01-11 DIAGNOSIS — F17210 Nicotine dependence, cigarettes, uncomplicated: Secondary | ICD-10-CM | POA: Insufficient documentation

## 2021-01-11 DIAGNOSIS — D509 Iron deficiency anemia, unspecified: Secondary | ICD-10-CM | POA: Diagnosis present

## 2021-01-11 NOTE — Assessment & Plan Note (Signed)
Lab review: 05/30/2013: Hemoglobin 11.6, MCV 77.3 07/13/2019: Hemoglobin 9.1, MCV 70 12/13/2020: Hemoglobin 7, MCV 62, iron saturation 2%, ferritin 12, TIBC 481  Recommendation: 3 doses of IV Venofer Return to clinic in 3 months with labs and follow-up

## 2021-01-12 ENCOUNTER — Telehealth: Payer: Self-pay | Admitting: Hematology and Oncology

## 2021-01-12 NOTE — Telephone Encounter (Signed)
Scheduled appointment per 10/05 los. Patient is aware. Pt requested Friday or saturdays. Those are the only days , he can come in.

## 2021-01-20 MED FILL — Iron Sucrose Inj 20 MG/ML (Fe Equiv): INTRAVENOUS | Qty: 15 | Status: AC

## 2021-01-21 ENCOUNTER — Inpatient Hospital Stay: Payer: BC Managed Care – PPO

## 2021-01-21 ENCOUNTER — Other Ambulatory Visit: Payer: Self-pay

## 2021-01-21 VITALS — BP 157/85 | HR 61 | Temp 98.2°F | Resp 18

## 2021-01-21 DIAGNOSIS — D509 Iron deficiency anemia, unspecified: Secondary | ICD-10-CM

## 2021-01-21 MED ORDER — ACETAMINOPHEN 325 MG PO TABS
650.0000 mg | ORAL_TABLET | Freq: Once | ORAL | Status: AC
  Administered 2021-01-21: 650 mg via ORAL
  Filled 2021-01-21: qty 2

## 2021-01-21 MED ORDER — DIPHENHYDRAMINE HCL 25 MG PO CAPS
50.0000 mg | ORAL_CAPSULE | Freq: Once | ORAL | Status: AC
  Administered 2021-01-21: 50 mg via ORAL
  Filled 2021-01-21: qty 2

## 2021-01-21 MED ORDER — SODIUM CHLORIDE 0.9 % IV SOLN
300.0000 mg | Freq: Once | INTRAVENOUS | Status: AC
  Administered 2021-01-21: 300 mg via INTRAVENOUS
  Filled 2021-01-21: qty 300

## 2021-01-21 MED ORDER — SODIUM CHLORIDE 0.9 % IV SOLN
Freq: Once | INTRAVENOUS | Status: AC
Start: 2021-01-21 — End: 2021-01-21

## 2021-01-21 NOTE — Patient Instructions (Signed)

## 2021-01-27 MED FILL — Iron Sucrose Inj 20 MG/ML (Fe Equiv): INTRAVENOUS | Qty: 15 | Status: AC

## 2021-01-28 ENCOUNTER — Other Ambulatory Visit: Payer: Self-pay

## 2021-01-28 ENCOUNTER — Inpatient Hospital Stay: Payer: BC Managed Care – PPO

## 2021-01-28 VITALS — BP 152/77 | HR 68 | Temp 98.0°F | Resp 18

## 2021-01-28 DIAGNOSIS — D509 Iron deficiency anemia, unspecified: Secondary | ICD-10-CM

## 2021-01-28 MED ORDER — SODIUM CHLORIDE 0.9 % IV SOLN
300.0000 mg | Freq: Once | INTRAVENOUS | Status: AC
  Administered 2021-01-28: 300 mg via INTRAVENOUS
  Filled 2021-01-28: qty 300

## 2021-01-28 MED ORDER — ACETAMINOPHEN 325 MG PO TABS
650.0000 mg | ORAL_TABLET | Freq: Once | ORAL | Status: AC
  Administered 2021-01-28: 650 mg via ORAL

## 2021-01-28 MED ORDER — DIPHENHYDRAMINE HCL 25 MG PO CAPS
ORAL_CAPSULE | ORAL | Status: AC
  Filled 2021-01-28: qty 2

## 2021-01-28 MED ORDER — SODIUM CHLORIDE 0.9 % IV SOLN
Freq: Once | INTRAVENOUS | Status: AC
Start: 2021-01-28 — End: 2021-01-28

## 2021-01-28 MED ORDER — DIPHENHYDRAMINE HCL 25 MG PO CAPS
50.0000 mg | ORAL_CAPSULE | Freq: Once | ORAL | Status: AC
  Administered 2021-01-28: 50 mg via ORAL

## 2021-01-28 NOTE — Patient Instructions (Signed)

## 2021-02-02 ENCOUNTER — Inpatient Hospital Stay (HOSPITAL_COMMUNITY)
Admission: EM | Admit: 2021-02-02 | Discharge: 2021-02-04 | DRG: 193 | Disposition: A | Discharge status: Home / Self Care | Payer: BC Managed Care – PPO | Attending: Internal Medicine | Admitting: Internal Medicine

## 2021-02-02 ENCOUNTER — Other Ambulatory Visit: Payer: Self-pay

## 2021-02-02 ENCOUNTER — Emergency Department (HOSPITAL_COMMUNITY): Payer: BC Managed Care – PPO

## 2021-02-02 DIAGNOSIS — D696 Thrombocytopenia, unspecified: Secondary | ICD-10-CM | POA: Diagnosis present

## 2021-02-02 DIAGNOSIS — R0602 Shortness of breath: Secondary | ICD-10-CM | POA: Diagnosis not present

## 2021-02-02 DIAGNOSIS — N400 Enlarged prostate without lower urinary tract symptoms: Secondary | ICD-10-CM | POA: Diagnosis present

## 2021-02-02 DIAGNOSIS — R0902 Hypoxemia: Secondary | ICD-10-CM | POA: Diagnosis present

## 2021-02-02 DIAGNOSIS — J9601 Acute respiratory failure with hypoxia: Secondary | ICD-10-CM | POA: Diagnosis not present

## 2021-02-02 DIAGNOSIS — F17200 Nicotine dependence, unspecified, uncomplicated: Secondary | ICD-10-CM | POA: Diagnosis present

## 2021-02-02 DIAGNOSIS — Z603 Acculturation difficulty: Secondary | ICD-10-CM | POA: Diagnosis present

## 2021-02-02 DIAGNOSIS — J101 Influenza due to other identified influenza virus with other respiratory manifestations: Secondary | ICD-10-CM | POA: Diagnosis not present

## 2021-02-02 DIAGNOSIS — Z791 Long term (current) use of non-steroidal anti-inflammatories (NSAID): Secondary | ICD-10-CM

## 2021-02-02 DIAGNOSIS — G629 Polyneuropathy, unspecified: Secondary | ICD-10-CM | POA: Diagnosis present

## 2021-02-02 DIAGNOSIS — J441 Chronic obstructive pulmonary disease with (acute) exacerbation: Secondary | ICD-10-CM | POA: Diagnosis present

## 2021-02-02 DIAGNOSIS — Z20822 Contact with and (suspected) exposure to covid-19: Secondary | ICD-10-CM | POA: Diagnosis present

## 2021-02-02 DIAGNOSIS — J9602 Acute respiratory failure with hypercapnia: Secondary | ICD-10-CM | POA: Diagnosis present

## 2021-02-02 DIAGNOSIS — Z79899 Other long term (current) drug therapy: Secondary | ICD-10-CM

## 2021-02-02 DIAGNOSIS — D509 Iron deficiency anemia, unspecified: Secondary | ICD-10-CM | POA: Diagnosis present

## 2021-02-02 LAB — I-STAT ARTERIAL BLOOD GAS, ED
Acid-Base Excess: 5 mmol/L — ABNORMAL HIGH (ref 0.0–2.0)
Bicarbonate: 33.4 mmol/L — ABNORMAL HIGH (ref 20.0–28.0)
Calcium, Ion: 1.16 mmol/L (ref 1.15–1.40)
HCT: 40 % (ref 39.0–52.0)
Hemoglobin: 13.6 g/dL (ref 13.0–17.0)
O2 Saturation: 97 %
Potassium: 3.6 mmol/L (ref 3.5–5.1)
Sodium: 137 mmol/L (ref 135–145)
TCO2: 35 mmol/L — ABNORMAL HIGH (ref 22–32)
pCO2 arterial: 66.9 mmHg (ref 32.0–48.0)
pH, Arterial: 7.306 — ABNORMAL LOW (ref 7.350–7.450)
pO2, Arterial: 101 mmHg (ref 83.0–108.0)

## 2021-02-02 LAB — CBC
HCT: 42.3 % (ref 39.0–52.0)
Hemoglobin: 13 g/dL (ref 13.0–17.0)
MCH: 23.9 pg — ABNORMAL LOW (ref 26.0–34.0)
MCHC: 30.7 g/dL (ref 30.0–36.0)
MCV: 77.9 fL — ABNORMAL LOW (ref 80.0–100.0)
Platelets: 102 10*3/uL — ABNORMAL LOW (ref 150–400)
RBC: 5.43 MIL/uL (ref 4.22–5.81)
WBC: 6.7 10*3/uL (ref 4.0–10.5)
nRBC: 0 % (ref 0.0–0.2)

## 2021-02-02 LAB — COMPREHENSIVE METABOLIC PANEL
ALT: 35 U/L (ref 0–44)
AST: 50 U/L — ABNORMAL HIGH (ref 15–41)
Albumin: 4 g/dL (ref 3.5–5.0)
Alkaline Phosphatase: 59 U/L (ref 38–126)
Anion gap: 8 (ref 5–15)
BUN: 7 mg/dL — ABNORMAL LOW (ref 8–23)
CO2: 30 mmol/L (ref 22–32)
Calcium: 9 mg/dL (ref 8.9–10.3)
Chloride: 98 mmol/L (ref 98–111)
Creatinine, Ser: 0.65 mg/dL (ref 0.61–1.24)
GFR, Estimated: >60 mL/min (ref >60)
Glucose, Bld: 132 mg/dL — ABNORMAL HIGH (ref 70–99)
Potassium: 3.7 mmol/L (ref 3.5–5.1)
Sodium: 136 mmol/L (ref 135–145)
Total Bilirubin: 0.8 mg/dL (ref 0.3–1.2)
Total Protein: 8.1 g/dL (ref 6.5–8.1)

## 2021-02-02 LAB — RESP PANEL BY RT-PCR (FLU A&B, COVID) ARPGX2
Influenza A by PCR: POSITIVE — AB
Influenza B by PCR: NEGATIVE
SARS Coronavirus 2 by RT PCR: NEGATIVE

## 2021-02-02 MED ORDER — FERROUS SULFATE 325 (65 FE) MG PO TABS
325.0000 mg | ORAL_TABLET | Freq: Every day | ORAL | Status: DC
  Administered 2021-02-02 – 2021-02-04 (×3): 325 mg via ORAL
  Filled 2021-02-02 (×3): qty 1

## 2021-02-02 MED ORDER — ACETAMINOPHEN 650 MG RE SUPP: 650.0000 mg | Freq: Four times a day (QID) | RECTAL | Status: DC | PRN

## 2021-02-02 MED ORDER — METHYLPREDNISOLONE SODIUM SUCC 125 MG IJ SOLR
125.0000 mg | Freq: Once | INTRAMUSCULAR | Status: AC
  Administered 2021-02-02: 125 mg via INTRAVENOUS
  Filled 2021-02-02: qty 2

## 2021-02-02 MED ORDER — OSELTAMIVIR PHOSPHATE 75 MG PO CAPS
75.0000 mg | ORAL_CAPSULE | Freq: Two times a day (BID) | ORAL | Status: DC
  Administered 2021-02-02 – 2021-02-04 (×4): 75 mg via ORAL
  Filled 2021-02-02 (×6): qty 1

## 2021-02-02 MED ORDER — ENOXAPARIN SODIUM 40 MG/0.4ML IJ SOSY
40.0000 mg | PREFILLED_SYRINGE | INTRAMUSCULAR | Status: DC
  Administered 2021-02-02 – 2021-02-03 (×2): 40 mg via SUBCUTANEOUS
  Filled 2021-02-02 (×2): qty 0.4

## 2021-02-02 MED ORDER — ALBUTEROL SULFATE (2.5 MG/3ML) 0.083% IN NEBU
5.0000 mg | INHALATION_SOLUTION | Freq: Once | RESPIRATORY_TRACT | Status: AC
  Filled 2021-02-02: qty 6

## 2021-02-02 MED ORDER — ALBUTEROL SULFATE (2.5 MG/3ML) 0.083% IN NEBU
5.0000 mg | INHALATION_SOLUTION | Freq: Once | RESPIRATORY_TRACT | Status: AC
  Administered 2021-02-02: 5 mg via RESPIRATORY_TRACT
  Filled 2021-02-02: qty 6

## 2021-02-02 MED ORDER — IPRATROPIUM-ALBUTEROL 0.5-2.5 (3) MG/3ML IN SOLN
3.0000 mL | Freq: Four times a day (QID) | RESPIRATORY_TRACT | Status: DC
  Administered 2021-02-02 (×2): 3 mL via RESPIRATORY_TRACT
  Filled 2021-02-02 (×2): qty 3

## 2021-02-02 MED ORDER — ALBUTEROL SULFATE (2.5 MG/3ML) 0.083% IN NEBU: 2.5000 mg | INHALATION_SOLUTION | RESPIRATORY_TRACT | Status: DC | PRN

## 2021-02-02 MED ORDER — ONDANSETRON HCL 4 MG PO TABS: 4.0000 mg | ORAL_TABLET | Freq: Four times a day (QID) | ORAL | Status: DC | PRN

## 2021-02-02 MED ORDER — SODIUM CHLORIDE 0.9 % IV BOLUS
500.0000 mL | Freq: Once | INTRAVENOUS | Status: AC
  Administered 2021-02-02: 500 mL via INTRAVENOUS

## 2021-02-02 MED ORDER — ACETAMINOPHEN 325 MG PO TABS: 650.0000 mg | ORAL_TABLET | Freq: Four times a day (QID) | ORAL | Status: DC | PRN

## 2021-02-02 MED ORDER — IPRATROPIUM-ALBUTEROL 0.5-2.5 (3) MG/3ML IN SOLN
3.0000 mL | Freq: Two times a day (BID) | RESPIRATORY_TRACT | Status: DC
  Administered 2021-02-03 – 2021-02-04 (×3): 3 mL via RESPIRATORY_TRACT
  Filled 2021-02-02 (×3): qty 3

## 2021-02-02 MED ORDER — NICOTINE 14 MG/24HR TD PT24
14.0000 mg | MEDICATED_PATCH | Freq: Every day | TRANSDERMAL | Status: DC
  Administered 2021-02-02 – 2021-02-04 (×3): 14 mg via TRANSDERMAL
  Filled 2021-02-02 (×3): qty 1

## 2021-02-02 MED ORDER — OSELTAMIVIR PHOSPHATE 75 MG PO CAPS
75.0000 mg | ORAL_CAPSULE | Freq: Once | ORAL | Status: AC
  Administered 2021-02-02: 75 mg via ORAL
  Filled 2021-02-02: qty 1

## 2021-02-02 MED ORDER — SODIUM CHLORIDE 0.9 % IV SOLN: INTRAVENOUS | Status: DC

## 2021-02-02 MED ORDER — GUAIFENESIN-DM 100-10 MG/5ML PO SYRP: 5.0000 mL | ORAL_SOLUTION | ORAL | Status: DC | PRN

## 2021-02-02 MED ORDER — IPRATROPIUM BROMIDE 0.02 % IN SOLN
0.5000 mg | Freq: Once | RESPIRATORY_TRACT | Status: AC
  Administered 2021-02-02: 0.5 mg via RESPIRATORY_TRACT
  Filled 2021-02-02: qty 2.5

## 2021-02-02 MED ORDER — ALBUTEROL SULFATE (2.5 MG/3ML) 0.083% IN NEBU
INHALATION_SOLUTION | RESPIRATORY_TRACT | Status: AC
  Administered 2021-02-02: 5 mg via RESPIRATORY_TRACT
  Filled 2021-02-02: qty 3

## 2021-02-02 MED ORDER — TAMSULOSIN HCL 0.4 MG PO CAPS
0.4000 mg | ORAL_CAPSULE | Freq: Every day | ORAL | Status: DC
  Administered 2021-02-02 – 2021-02-04 (×3): 0.4 mg via ORAL
  Filled 2021-02-02 (×3): qty 1

## 2021-02-02 MED ORDER — ONDANSETRON HCL 4 MG/2ML IJ SOLN: 4.0000 mg | Freq: Four times a day (QID) | INTRAMUSCULAR | Status: DC | PRN

## 2021-02-02 MED ORDER — DULOXETINE HCL 30 MG PO CPEP
30.0000 mg | ORAL_CAPSULE | Freq: Every day | ORAL | Status: DC
  Administered 2021-02-02 – 2021-02-04 (×3): 30 mg via ORAL
  Filled 2021-02-02 (×4): qty 1

## 2021-02-02 NOTE — ED Provider Notes (Addendum)
[facility] EMERGENCY DEPARTMENT Provider Note   CSN: [id number] Arrival date & time: 02/02/21  0272     History Chief Complaint  Patient presents with  . Shortness of Breath    Bob Scott is a 75 y.o. male.  HPI Level 5 caveat Due to acuity of disease and limitation due to language barrier.  However, we are unable to find an IT trainer in any of the languages that the patient speaks.  He is able to give some history in [place] and his son presents at the bedside also This is a 75 year old man who is a smoker who comes in today complaining of dyspnea.  He reports that 2 days ago he became much more dyspneic.  He has had some nonproductive cough.  He has had some chills but no fever.  He has a smoker and continues to smoke.  He denies having any breathing medications such as inhalers at home.  He is not on oxygen at home.  He presented here with oxygen sats to 86-87%.  He was placed on oxygen prior to my evaluation and saturations are up to 100%.  He has not had COVID or known exposures.  He has had 2 COVID vaccines with no boosters.  Timeframe unknown.     Past Medical History:  Diagnosis Date  . Allergy   . Varicose vein     Patient Active Problem List   Diagnosis Date Noted  . Iron deficiency anemia 12/26/2020  . Onychomycosis 05/30/2013  . Itching 05/30/2013  . History of itching of eye 05/30/2013  . Peripheral neuropathy 05/30/2013    No past surgical history on file.     No family history on file.  Social History   Tobacco Use  . Smoking status: Some Days  . Smokeless tobacco: Never  Substance Use Topics  . Alcohol use: Not Currently  . Drug use: Never    Home Medications Prior to Admission medications   Medication Sig Start Date End Date Taking? Authorizing Provider  azelastine (OPTIVAR) 0.05 % ophthalmic solution Place 1 drop into both eyes 2 (two) times daily. 07/13/19   Shade Flood, MD  DULoxetine (CYMBALTA) 30 MG  capsule TAKE 1 CAPSULE BY MOUTH EVERY DAY 12/15/20   Hoy Register, MD  fluticasone (FLONASE) 50 MCG/ACT nasal spray Place 2 sprays into both nostrils daily. 07/13/19   Shade Flood, MD  gabapentin (NEURONTIN) 100 MG capsule Take 1-2 capsules (100-200 mg total) by mouth at bedtime. 07/13/19   Shade Flood, MD  Iron, Ferrous Sulfate, 325 (65 Fe) MG TABS Take 325 mg by mouth daily. 12/14/20   Hoy Register, MD  meloxicam (MOBIC) 7.5 MG tablet Take 1 tablet (7.5 mg total) by mouth daily. 11/22/20   Hoy Register, MD  tamsulosin (FLOMAX) 0.4 MG CAPS capsule Take 1 capsule (0.4 mg total) by mouth daily. 11/22/20   Hoy Register, MD    Allergies    Patient has no known allergies.  Review of Systems   Review of Systems  Constitutional:  Positive for chills.  Eyes: Negative.   Respiratory:  Positive for cough and shortness of breath.   Cardiovascular: Negative.   Gastrointestinal: Negative.   Endocrine: Negative.   Genitourinary: Negative.   Musculoskeletal: Negative.   Skin: Negative.   Allergic/Immunologic: Negative.   Neurological: Negative.   Hematological: Negative.   Psychiatric/Behavioral: Negative.    All other systems reviewed and are negative.  Physical Exam Updated Vital Signs BP 124/69  Pulse 62   Temp 98.2 F (36.8 C) (Oral)   Resp 20   Ht 1.664 m (5' 5.5")   Wt 55.6 kg   SpO2 100%   BMI 20.09 kg/m   Physical Exam Vitals and nursing note reviewed.  Constitutional:      Appearance: He is well-developed.  HENT:     Head: Normocephalic.     Mouth/Throat:     Pharynx: Oropharynx is clear.  Eyes:     Pupils: Pupils are equal, round, and reactive to light.  Cardiovascular:     Rate and Rhythm: Normal rate and regular rhythm.  Pulmonary:     Effort: Tachypnea present.     Breath sounds: Examination of the right-upper field reveals decreased breath sounds. Examination of the left-upper field reveals decreased breath sounds. Examination of the right-middle  field reveals decreased breath sounds and wheezing. Examination of the left-middle field reveals decreased breath sounds and wheezing. Examination of the right-lower field reveals decreased breath sounds. Examination of the left-lower field reveals decreased breath sounds. Decreased breath sounds and wheezing present. No rhonchi or rales.  Chest:     Chest wall: No mass, deformity, tenderness or crepitus.  Abdominal:     General: Bowel sounds are normal.     Palpations: Abdomen is soft.  Musculoskeletal:        General: Normal range of motion.     Cervical back: Normal range of motion.     Right lower leg: No tenderness. No edema.     Left lower leg: No edema.  Skin:    General: Skin is warm.     Capillary Refill: Capillary refill takes less than 2 seconds.  Neurological:     General: No focal deficit present.     Mental Status: He is alert.  Psychiatric:        Mood and Affect: Mood normal.    ED Results / Procedures / Treatments   Labs (all labs ordered are listed, but only abnormal results are displayed) Labs Reviewed  RESP PANEL BY RT-PCR (FLU A&B, COVID) ARPGX2 - Abnormal; Notable for the following components:      Result Value   Influenza A by PCR POSITIVE (*)    All other components within normal limits  CBC - Abnormal; Notable for the following components:   MCV 77.9 (*)    MCH 23.9 (*)    Platelets 102 (*)    All other components within normal limits  COMPREHENSIVE METABOLIC PANEL - Abnormal; Notable for the following components:   Glucose, Bld 132 (*)    BUN 7 (*)    AST 50 (*)    All other components within normal limits  I-STAT ARTERIAL BLOOD GAS, ED - Abnormal; Notable for the following components:   pH, Arterial 7.306 (*)    pCO2 arterial 66.9 (*)    Bicarbonate 33.4 (*)    TCO2 35 (*)    Acid-Base Excess 5.0 (*)    All other components within normal limits    EKG EKG Interpretation  Date/Time:  Thursday February 02 2021 10:13:02 EDT Ventricular Rate:   72 PR Interval:  139 QRS Duration: 94 QT Interval:  416 QTC Calculation: 456 R Axis:   91 Text Interpretation: Sinus rhythm Right axis deviation Confirmed by Margarita Grizzle [phone]) on 02/02/2021 11:49:48 AM  Radiology DG Chest Port 1 View  Result Date: 02/02/2021 CLINICAL DATA:  Shortness of breath and wheezing EXAM: PORTABLE CHEST 1 VIEW COMPARISON:  Portable exam 1018 hours compared  to 05/10/2016 FINDINGS: Normal heart size, mediastinal contours, and pulmonary vascularity. Atherosclerotic calcification aorta. Lungs hyperinflated but clear. No pulmonary infiltrate, pleural effusion, or pneumothorax. Osseous structures unremarkable. IMPRESSION: Hyperinflated lungs without acute infiltrate. Aortic Atherosclerosis (ICD10-I70.0). Electronically Signed   By: Ulyses Southward M.D.   On: 02/02/2021 10:24    Procedures .Critical Care Performed by: Margarita Grizzle, MD Authorized by: Margarita Grizzle, MD   Critical care provider statement:    Critical care time (minutes):  45   Critical care end time:  02/02/2021 12:42 PM   Critical care was necessary to treat or prevent imminent or life-threatening deterioration of the following conditions:  Respiratory failure   Critical care was time spent personally by me on the following activities:  Discussions with consultants, evaluation of patient's response to treatment, examination of patient, ordering and performing treatments and interventions, ordering and review of laboratory studies, ordering and review of radiographic studies, re-evaluation of patient's condition and pulse oximetry   Medications Ordered in ED Medications  albuterol (PROVENTIL) (2.5 MG/3ML) 0.083% nebulizer solution 5 mg (has no administration in time range)  oseltamivir (TAMIFLU) capsule 75 mg (has no administration in time range)  albuterol (PROVENTIL) (2.5 MG/3ML) 0.083% nebulizer solution 5 mg (5 mg Nebulization Given 02/02/21 1011)  ipratropium (ATROVENT) nebulizer solution 0.5 mg  (0.5 mg Nebulization Given 02/02/21 1014)  methylPREDNISolone sodium succinate (SOLU-MEDROL) 125 mg/2 mL injection 125 mg (125 mg Intravenous Given 02/02/21 1055)  sodium chloride 0.9 % bolus 500 mL (0 mLs Intravenous Stopped 02/02/21 1134)    ED Course  I have reviewed the triage vital signs and the nursing notes.  Pertinent labs & imaging results that were available during my care of the patient were reviewed by me and considered in my medical decision making (see chart for details).  Clinical Course as of 02/02/21 1242  Thu Feb 02, 2021  1151 Respiratory panel reviewed and flu a positive [DR]    Clinical Course User Index [DR] Margarita Grizzle, MD   MDM Rules/Calculators/A&P                           , Chest x-Kevan Prouty, COVID, albuterol, labs and Solu-Medrol ordered.  Labs reviewed.  CBC within normal limits ABG pH 7.30 PCO2 66 and PO2 101.  Patient has good respiratory drive.  He has received albuterol and Solu-Medrol and continues to have decreased air movement but improvement in wheezing.  75 year old man smoker with likely undiagnosed COPD presents today with respiratory distress, cough, and URI symptoms.  Patient is flu a positive.  He required Solu-Medrol and albuterol with some decrease in his wheezing and improvement in his respiratory status.  However, he does have some hypercarbic respiratory failure with a pH 7.30 and PCO2 of 66.  He is oxygenating well here on nasal cannula He does not appear to need any additional respiratory support beyond management of his wheezing, influenza symptoms, at this time. I am consulting unassigned medicine for admission. Discussed with Dr. Sheppard Penton who will see in consult for admission Final Clinical Impression(s) / ED Diagnoses Final diagnoses:  Influenza A  Acute respiratory failure with hypoxia and hypercapnia (HCC)  COPD exacerbation [facility])    Rx / DC Orders ED Discharge Orders     None        Margarita Grizzle, MD 02/02/21 1242     Margarita Grizzle, MD 02/09/21 1221

## 2021-02-02 NOTE — ED Notes (Signed)
Please call.

## 2021-02-02 NOTE — Progress Notes (Signed)
02/02/2021 Patient transfer from the emergency room to [facility]west at 1713. He is alert, oriented to person, place, time and situation. Patient skin skin is intact and feet are dry. [employer] systems engineer.

## 2021-02-02 NOTE — H&P (Addendum)
History and Physical    Bob Scott CBJ:[id number] DOB: 25-Jul-1945 DOA: 02/02/2021  PCP: Pcp, No Consultants:  Dr. Pamelia Hoit hematology  Patient coming from:  Home - lives with   Chief Complaint: shortness of breath   HPI: Bob Scott is a 75 y.o. male with medical history significant of iron deficiency anemia, peripheral neuropathy, tobacco abuse who presented to ED with 3 day history of cough and shortness of breath. His son was worried about him so he brought him to the hospital. He denies any fever/chills. No muscle aches. He has fatigue. He presented to Ed with oxygen sats to 86-87% on room air.   He is originally from [place]. He speaks some english, but we do not have his language in our official translator and his wife was present with also limited english. Could not get in touch with his daughter.   He has not had his flu shot.   He denies any fever/chills, has had a headache. No chest pain or palpitations. No stomach pain or N/V/D. No dysuria or other urinary symptoms. He has had decreased PO intake.   ED Course: vitals: Afebrile, blood pressure 142/82, heart rate 87, respiratory rate 24, oxygen 86% on room air>99% on 2L [place].  Pertinent labs: Positive influenza A, platelets 102, AST 50, i-STAT arterial: pH 7.306, PCO2 66.9, PO2 101 Chest x-ray: Hyperinflated lungs without acute infiltrate. In ED patient given albuterol neb, Atrovent, methylprednisolone, Tamiflu and a 500 cc normal saline bolus.  Due to oxygen requirement [facility] was asked to admit.  Review of Systems: As per HPI; otherwise review of systems reviewed and negative.   Ambulatory Status:  Ambulates without assistance   Past Medical History:  Diagnosis Date   Allergy    Varicose vein     No past surgical history on file.  Social History   Socioeconomic History   Marital status: Married    Spouse name: Not on file   Number of children: 14   Years of education: Not on file   Highest education level: Not on file   Occupational History   Occupation: housekeeping    Comment: [employer]  Tobacco Use   Smoking status: Some Days   Smokeless tobacco: Never  Substance and Sexual Activity   Alcohol use: Not Currently   Drug use: Never   Sexual activity: Not Currently    Birth control/protection: Post-menopausal  Other Topics Concern   Not on file  Social History Narrative   ** Merged History Encounter **       Marital status: married; from [place]    Children: 14    Lives: with wife, 3 children.    Employment; housekeeping [employer]    Tobacco: quit in 2012; smoked x 6 years    Alcohol: none   Drugs: none   Social Determinants of Corporate investment banker Strain: Not on file  Food Insecurity: Not on file  Transportation Needs: Not on file  Physical Activity: Not on file  Stress: Not on file  Social Connections: Not on file  Intimate Partner Violence: Not on file    No Known Allergies  No family history on file.  Prior to Admission medications   Medication Sig Start Date End Date Taking? Authorizing Provider  azelastine (OPTIVAR) 0.05 % ophthalmic solution Place 1 drop into both eyes 2 (two) times daily. 07/13/19   Shade Flood, MD  DULoxetine (CYMBALTA) 30 MG capsule TAKE 1 CAPSULE BY MOUTH EVERY DAY 12/15/20   Hoy Register, MD  fluticasone (FLONASE) 50 MCG/ACT nasal spray Place 2 sprays into both nostrils daily. 07/13/19   Shade Flood, MD  gabapentin (NEURONTIN) 100 MG capsule Take 1-2 capsules (100-200 mg total) by mouth at bedtime. 07/13/19   Shade Flood, MD  Iron, Ferrous Sulfate, 325 (65 Fe) MG TABS Take 325 mg by mouth daily. 12/14/20   Hoy Register, MD  meloxicam (MOBIC) 7.5 MG tablet Take 1 tablet (7.5 mg total) by mouth daily. 11/22/20   Hoy Register, MD  tamsulosin (FLOMAX) 0.4 MG CAPS capsule Take 1 capsule (0.4 mg total) by mouth daily. 11/22/20   Hoy Register, MD    Physical Exam: Vitals:   02/02/21 1130 02/02/21 1200 02/02/21 1230 02/02/21 1300  BP: 125/63  124/69 113/65 128/72  Pulse: 64 62 (!) 59 63  Resp: 19 20 19 18   Temp:      TempSrc:      SpO2: 98% 100% 99% 98%  Weight:      Height:         General:  Appears calm and comfortable and is in NAD Eyes:  PERRL, EOMI, normal lids, iris ENT:  grossly normal hearing, lips & tongue, mmm; poor dentition Neck:  no LAD, masses or thyromegaly; no carotid bruits Cardiovascular:  RRR, no m/r/g. No LE edema.  Respiratory:   tight throughout lung fields with decreased air flow. Rales bibasilar. Very occasional expiratory wheeze over anterior chest.  Talking in full sentences. Slight increased work of breathing.  Abdomen:  soft, NT, ND, NABS Back:   normal alignment, no CVAT Skin:  no rash or induration seen on limited exam Musculoskeletal:  grossly normal tone BUE/BLE, good ROM, no bony abnormality Lower extremity:  No LE edema.  Limited foot exam with no ulcerations.  2+ distal pulses. Psychiatric:  grossly normal mood and affect, speech fluent and appropriate, AOx3 Neurologic:  CN 2-12 grossly intact, moves all extremities in coordinated fashion, sensation intact    Radiological Exams on Admission: Independently reviewed - see discussion in A/P where applicable  DG Chest Port 1 View  Result Date: 02/02/2021 CLINICAL DATA:  Shortness of breath and wheezing EXAM: PORTABLE CHEST 1 VIEW COMPARISON:  Portable exam 1018 hours compared to 05/10/2016 FINDINGS: Normal heart size, mediastinal contours, and pulmonary vascularity. Atherosclerotic calcification aorta. Lungs hyperinflated but clear. No pulmonary infiltrate, pleural effusion, or pneumothorax. Osseous structures unremarkable. IMPRESSION: Hyperinflated lungs without acute infiltrate. Aortic Atherosclerosis (ICD10-I70.0). Electronically Signed   By: 07/08/2016 M.D.   On: 02/02/2021 10:24    EKG: Independently reviewed.  NSR with rate 72; nonspecific ST changes with no evidence of acute ischemia. No previous ekg for comparison.    Labs on  Admission: I have personally reviewed the available labs and imaging studies at the time of the admission.  Pertinent labs:   Positive influenza A,  platelets 102,  AST 50,  i-STAT arterial: pH 7.306, PCO2 66.9, PO2 101   Assessment/Plan Principal Problem:   Acute respiratory failure with hypoxia  -75 year old male presenting to ED with acute respiratory failure with hypoxia with oxygen satting 86 to 87% on room air, retractions, and limited air movement with wheezing. -Likely secondary to influenza A plus possibly undiagnosed COPD. -Admit to MedSurg -Supplemental oxygen to maintain saturations greater than 92% and wean as tolerated -Supportive care/Tamiflu for flu as well as treatment for possible COPD exacerbation from flu. Scheduled duonebs x4 and SABA prn.  -1/3 cardinal symptoms, abx not indicated.  -given solumedrol in ED. He is tight, but  no wheezing. Will hold steroids for now.   Active Problems:   Influenza A -supportive care with robitussin/ IVF/oxygen  -droplet precautions  -IVF -tamiflu Bid x 5 days(right outside the 48 hour window and will likely still benefit)  -wean oxygen as tolerated    Thrombocytopenia (HCC) -no previous hx of low platelets -repeat in AM to make sure no lab error, stop NSAIDs.  -recent b12 wnl, does have history of IDA   Iron deficiency anemia Stable. Continue oral iron. Iron infusion: 10/15 and 10/22.  Followed outpatient by hematology and has been referred to GI.     Peripheral neuropathy Continue home medication. On cymbalta   Tobacco abuse Nicotine patch, encouraged to quit.   Bph Continue flomax    Body mass index is 20.09 kg/m.   Level of care: Med-Surg DVT prophylaxis:  Lovenox  Code Status:  Full - confirmed with patient Family Communication: wife at bedside: margaret  Disposition Plan:  The patient is from: home  Anticipated d/c is to: home  Requires inpatient hospitalization due to hypoxia and requirement of  supplemental oxygen and is at significant risk of  worsening, requires constant monitoring and assessment.    Patient is currently: ill, but stable.  Consults called: none  Admission status:  observation   Dragon dictation used in completing this note.   Orland Mustard MD [facility]   How to contact the [facility] Attending or Consulting provider 7A - 7P or covering provider during after hours 7P -7A, for this patient?  Check the care team in [facility] and look for a) attending/consulting [facility] provider listed and b) the [facility] team listed Log into [email or web address] and use [place]'s universal password to access. If you do not have the password, please contact the hospital operator. Locate the [facility] provider you are looking for under [facility] and page to a number that you can be directly reached. If you still have difficulty reaching the provider, please page the [facility] (Director on Call) for the Hospitalists listed on amion for assistance.   02/02/2021, 1:06 PM

## 2021-02-03 DIAGNOSIS — F17200 Nicotine dependence, unspecified, uncomplicated: Secondary | ICD-10-CM | POA: Diagnosis present

## 2021-02-03 DIAGNOSIS — N400 Enlarged prostate without lower urinary tract symptoms: Secondary | ICD-10-CM | POA: Diagnosis present

## 2021-02-03 DIAGNOSIS — D509 Iron deficiency anemia, unspecified: Secondary | ICD-10-CM | POA: Diagnosis present

## 2021-02-03 DIAGNOSIS — R0902 Hypoxemia: Secondary | ICD-10-CM | POA: Diagnosis present

## 2021-02-03 DIAGNOSIS — Z79899 Other long term (current) drug therapy: Secondary | ICD-10-CM | POA: Diagnosis not present

## 2021-02-03 DIAGNOSIS — R0602 Shortness of breath: Secondary | ICD-10-CM | POA: Diagnosis present

## 2021-02-03 DIAGNOSIS — Z791 Long term (current) use of non-steroidal anti-inflammatories (NSAID): Secondary | ICD-10-CM | POA: Diagnosis not present

## 2021-02-03 DIAGNOSIS — Z603 Acculturation difficulty: Secondary | ICD-10-CM | POA: Diagnosis present

## 2021-02-03 DIAGNOSIS — Z20822 Contact with and (suspected) exposure to covid-19: Secondary | ICD-10-CM | POA: Diagnosis present

## 2021-02-03 DIAGNOSIS — J101 Influenza due to other identified influenza virus with other respiratory manifestations: Secondary | ICD-10-CM | POA: Diagnosis present

## 2021-02-03 DIAGNOSIS — D696 Thrombocytopenia, unspecified: Secondary | ICD-10-CM | POA: Diagnosis present

## 2021-02-03 DIAGNOSIS — G629 Polyneuropathy, unspecified: Secondary | ICD-10-CM | POA: Diagnosis present

## 2021-02-03 DIAGNOSIS — J441 Chronic obstructive pulmonary disease with (acute) exacerbation: Secondary | ICD-10-CM | POA: Diagnosis present

## 2021-02-03 DIAGNOSIS — J9601 Acute respiratory failure with hypoxia: Secondary | ICD-10-CM | POA: Diagnosis present

## 2021-02-03 DIAGNOSIS — J9602 Acute respiratory failure with hypercapnia: Secondary | ICD-10-CM | POA: Diagnosis present

## 2021-02-03 LAB — COMPREHENSIVE METABOLIC PANEL
ALT: 27 U/L (ref 0–44)
AST: 39 U/L (ref 15–41)
Albumin: 3.1 g/dL — ABNORMAL LOW (ref 3.5–5.0)
Alkaline Phosphatase: 49 U/L (ref 38–126)
Anion gap: 6 (ref 5–15)
BUN: 10 mg/dL (ref 8–23)
CO2: 27 mmol/L (ref 22–32)
Calcium: 8.4 mg/dL — ABNORMAL LOW (ref 8.9–10.3)
Chloride: 103 mmol/L (ref 98–111)
Creatinine, Ser: 0.59 mg/dL — ABNORMAL LOW (ref 0.61–1.24)
GFR, Estimated: >60 mL/min (ref >60)
Glucose, Bld: 126 mg/dL — ABNORMAL HIGH (ref 70–99)
Potassium: 4.2 mmol/L (ref 3.5–5.1)
Sodium: 136 mmol/L (ref 135–145)
Total Bilirubin: 0.4 mg/dL (ref 0.3–1.2)
Total Protein: 6.5 g/dL (ref 6.5–8.1)

## 2021-02-03 LAB — CBC
HCT: 35.8 % — ABNORMAL LOW (ref 39.0–52.0)
Hemoglobin: 11.1 g/dL — ABNORMAL LOW (ref 13.0–17.0)
MCH: 24.1 pg — ABNORMAL LOW (ref 26.0–34.0)
MCHC: 31 g/dL (ref 30.0–36.0)
MCV: 77.7 fL — ABNORMAL LOW (ref 80.0–100.0)
Platelets: 103 10*3/uL — ABNORMAL LOW (ref 150–400)
RBC: 4.61 MIL/uL (ref 4.22–5.81)
WBC: 4.2 10*3/uL (ref 4.0–10.5)
nRBC: 0 % (ref 0.0–0.2)

## 2021-02-03 MED ORDER — PREDNISONE 20 MG PO TABS
20.0000 mg | ORAL_TABLET | Freq: Every day | ORAL | Status: DC
  Administered 2021-02-04: 20 mg via ORAL
  Filled 2021-02-03: qty 1

## 2021-02-03 MED FILL — Iron Sucrose Inj 20 MG/ML (Fe Equiv): INTRAVENOUS | Qty: 15 | Status: AC

## 2021-02-03 NOTE — Evaluation (Signed)
Physical Therapy Evaluation Patient Details Name: Bob Scott MRN: [id number] DOB: Dec 27, 1945 Today's Date: 02/03/2021  History of Present Illness  Bob Scott, 75 y.o. male with PMH of IDA,peripheral neuropathy, tobacco abuse who presented admitted with 3 day history of cough and shortness of breath.  Found to be hypoxic and positive for influenza A.  Clinical Impression  Patient presents with mobility likely close to baseline.  Able to mobilize to bathroom and in hallway unaided.  Maintained on 2L O2 with SpO2 93%.  Still mild to moderate dyspnea noted and some congestion.  Patient without current skilled PT needs.  Will sign off, but recommend nursing assist with O2 for hallway ambulation.        Recommendations for follow up therapy are one component of a multi-disciplinary discharge planning process, led by the attending physician.  Recommendations may be updated based on patient status, additional functional criteria and insurance authorization.  Follow Up Recommendations No PT follow up    Assistance Recommended at Discharge Set up Supervision/Assistance  Functional Status Assessment Patient has not had a recent decline in their functional status  Equipment Recommendations  None recommended by PT    Recommendations for Other Services       Precautions / Restrictions Precautions Precaution Comments: watch O2      Mobility  Bed Mobility Overal bed mobility: Modified Independent                  Transfers Overall transfer level: Modified independent                      Ambulation/Gait Ambulation/Gait assistance: Independent Gait Distance (Feet): 200 Feet Assistive device: None Gait Pattern/deviations: Step-through pattern;WFL(Within Functional Limits)     General Gait Details: in hallway ambulation on 2L O2 SpO2 93%; mild to moderate dyspnea  Stairs            Wheelchair Mobility    Modified Rankin (Stroke Patients Only)       Balance                                              Pertinent Vitals/Pain Pain Assessment: Faces Faces Pain Scale: Hurts a little bit Pain Location: chest sore from coughing Pain Descriptors / Indicators: Sore Pain Intervention(s): Monitored during session    Home Living Family/patient expects to be discharged to:: Private residence Living Arrangements: Spouse/significant other Available Help at Discharge: Family Type of Home: House Home Access: Stairs to enter   Secretary/administrator of Steps: 3   Home Layout: One level Home Equipment: None      Prior Function Prior Level of Function : Independent/Modified Independent               ADLs Comments: not cooking but doing some cleaning     Hand Dominance        Extremity/Trunk Assessment   Upper Extremity Assessment Upper Extremity Assessment: Overall WFL for tasks assessed    Lower Extremity Assessment Lower Extremity Assessment: Overall WFL for tasks assessed       Communication   Communication: No difficulties  Cognition Arousal/Alertness: Awake/alert Behavior During Therapy: WFL for tasks assessed/performed Overall Cognitive Status: Within Functional Limits for tasks assessed  General Comments General comments (skin integrity, edema, etc.): wife in the room; patient evidently had incontinent episode of diarrhea as socks stained and area of the floor stained; changed socks and cleaned floor, patient toileted on his own in bathroom    Exercises     Assessment/Plan    PT Assessment Patient does not need any further PT services (due to independent with mobility)  PT Problem List         PT Treatment Interventions      PT Goals (Current goals can be found in the Care Plan section)  Acute Rehab PT Goals PT Goal Formulation: All assessment and education complete, DC therapy    Frequency     Barriers to discharge         Co-evaluation               AM-PAC PT "6 Clicks" Mobility  Outcome Measure Help needed turning from your back to your side while in a flat bed without using bedrails?: None Help needed moving from lying on your back to sitting on the side of a flat bed without using bedrails?: None Help needed moving to and from a bed to a chair (including a wheelchair)?: None Help needed standing up from a chair using your arms (e.g., wheelchair or bedside chair)?: None Help needed to walk in hospital room?: None Help needed climbing 3-5 steps with a railing? : None 6 Click Score: 24    End of Session Equipment Utilized During Treatment: Oxygen Activity Tolerance: Patient tolerated treatment well Patient left: in chair;with call bell/phone within reach;with family/visitor present   PT Visit Diagnosis: Muscle weakness (generalized) (M62.81)    Time: 6301-6010 PT Time Calculation (min) (ACUTE ONLY): 23 min   Charges:   PT Evaluation $PT Eval Low Complexity: 1 Low          Sheran Lawless, PT Acute Rehabilitation Services [phone] Office:[phone] 02/03/2021   Bob Scott 02/03/2021, 6:16 PM

## 2021-02-03 NOTE — Progress Notes (Signed)
PROGRESS NOTE    Bob Scott  NOB:[id number] DOB: [birth year] DOA: 02/02/2021 PCP: Pcp, No   Chief Complaint  Patient presents with   Shortness of Breath  Brief Narrative/Hospital Course: Bob Scott, 75 y.o. male with PMH of IDA,peripheral neuropathy, tobacco abuse who presented to ED with 3 day history of cough and shortness of breath. Seen in the ED found to be hypoxic 88% on room air needing 2 L nasal cannula tachypneic 24 afebrile, labs with positive for influenza A thrombocytopenia AST 50, PCO2 66, chest x-ray hyperinflated lungs without acute infiltrate, patient was placed on bronchodilators Solu-Medrol Tamiflu and admitted. Subjective: Seen and examined.  He is alert awake complains of ongoing shortness of breath difficulty with exertion Still did not tolerate nasal cannula.  Some cough.  No fever.  Assessment & Plan:  Acute respiratory failure with hypoxia due to influenza Influenza A infection: Continue Tamiflu, Robitussin, supplemental oxygen droplet precaution.  Hydrate gently, wean oxygen as tolerated.  No indication for antibiotics but will check procalcitonin, continue bronchodilators.  Likely has underdiagnosed COPD contributing to his hypoxia.  He is wheezing and diminished breath sounds are restarted on prednisone orally.  Peripheral neuropathy: Stable continue Scott meds Iron deficiency anemia: Monitor hemoglobin iron infusion on 10/15 and 10/22.  Followed by hematology and has been referred to GI Thrombocytopenia : No previous labs available recent B12 normal ID could contribute.  Monitor closely could be in the setting of acute viral infection Tobacco abuse we discussed cessation BPH continue Flomax.  DVT prophylaxis: enoxaparin (LOVENOX) injection 40 mg Start: 02/02/21 1400 Code Status:   Code Status: Full Code Family Communication: plan of care discussed with patient at bedside. Status is: Observation Remains hospitalized for ongoing management of hypoxic  respiratory failure shortness of breath  Objective: Vitals last 24 hrs: Vitals:   02/02/21 1713 02/02/21 2151 02/03/21 0431 02/03/21 0836  BP: 127/65 123/69 140/79   Pulse: 66 63 69   Resp: 17 17 18    Temp: 98.3 F (36.8 C) 98.1 F (36.7 C) 97.9 F (36.6 C)   TempSrc: Oral Oral    SpO2: 92% 97% 97% 98%  Weight: 53.4 kg     Height: 5\' 5"  (1.651 m)      Weight change:   Intake/Output Summary (Last 24 hours) at 02/03/2021 1212 Last data filed at 02/03/2021 0400 Gross per 24 hour  Intake 1053.12 ml  Output 800 ml  Net 253.12 ml   Net IO Since Admission: 753.12 mL [02/03/21 1212]   Physical Examination: General exam: AA0x3, weak,older than stated age. HEENT:Oral mucosa moist, Ear/Nose WNL grossly,dentition normal. Respiratory system: B/l diminished with wheezing on expiration BS, no use of accessory muscle, non tender. Cardiovascular system: S1 & S2 +,No JVD. Gastrointestinal system: Abdomen soft, NT,ND, BS+. Nervous System:Alert, awake, moving extremities. Extremities: edema none, distal peripheral pulses palpable.  Skin: No rashes, no icterus. MSK: Normal muscle bulk, tone, power.  Medications reviewed:  Scheduled Meds:  DULoxetine  30 mg Oral Daily   enoxaparin (LOVENOX) injection  40 mg Subcutaneous Q24H   ferrous sulfate  325 mg Oral Daily   ipratropium-albuterol  3 mL Nebulization BID   nicotine  14 mg Transdermal Daily   oseltamivir  75 mg Oral BID   tamsulosin  0.4 mg Oral Daily   Continuous Infusions:  sodium chloride 75 mL/hr at 02/02/21 1347    Diet Order             Diet Carb Modified Fluid consistency: Thin; Room service  appropriate? Yes  Diet effective now                          Weight change:   Wt Readings from Last 3 Encounters:  02/02/21 53.4 kg  01/11/21 55.6 kg  03/16/20 59.8 kg     Consultants:see note  Procedures:see note Antimicrobials: Anti-infectives (From admission, onward)    Start     Dose/Rate Route Frequency  Ordered Stop   02/02/21 2200  oseltamivir (TAMIFLU) capsule 75 mg        75 mg Oral 2 times daily 02/02/21 1310 02/07/21 2159   02/02/21 1215  oseltamivir (TAMIFLU) capsule 75 mg        75 mg Oral  Once 02/02/21 1209 02/02/21 1301      Culture/Microbiology No results found for: SDES, SPECREQUEST, CULT, REPTSTATUS  Other culture-see note  Unresulted Labs (From admission, onward)    None      Data Reviewed: I have personally reviewed following labs and imaging studies CBC: Recent Labs  Lab 02/02/21 1022 02/02/21 1030 02/03/21 0244  WBC 6.7  --  4.2  HGB 13.0 13.6 11.1*  HCT 42.3 40.0 35.8*  MCV 77.9*  --  77.7*  PLT 102*  --  103*   Basic Metabolic Panel: Recent Labs  Lab 02/02/21 1022 02/02/21 1030 02/03/21 0244  NA 136 137 136  K 3.7 3.6 4.2  CL 98  --  103  CO2 30  --  27  GLUCOSE 132*  --  126*  BUN 7*  --  10  CREATININE 0.65  --  0.59*  CALCIUM 9.0  --  8.4*   GFR: Estimated Creatinine Clearance: 60.3 mL/min (A) (by C-G formula based on SCr of 0.59 mg/dL (L)). Liver Function Tests: Recent Labs  Lab 02/02/21 1022 02/03/21 0244  AST 50* 39  ALT 35 27  ALKPHOS 59 49  BILITOT 0.8 0.4  PROT 8.1 6.5  ALBUMIN 4.0 3.1*   No results for input(s): LIPASE, AMYLASE in the last 168 hours. No results for input(s): AMMONIA in the last 168 hours. Coagulation Profile: No results for input(s): INR, PROTIME in the last 168 hours. Cardiac Enzymes: No results for input(s): CKTOTAL, CKMB, CKMBINDEX, TROPONINI in the last 168 hours. BNP (last 3 results) No results for input(s): PROBNP in the last 8760 hours. HbA1C: No results for input(s): HGBA1C in the last 72 hours. CBG: No results for input(s): GLUCAP in the last 168 hours. Lipid Profile: No results for input(s): CHOL, HDL, LDLCALC, TRIG, CHOLHDL, LDLDIRECT in the last 72 hours. Thyroid Function Tests: No results for input(s): TSH, T4TOTAL, FREET4, T3FREE, THYROIDAB in the last 72 hours. Anemia Panel: No  results for input(s): VITAMINB12, FOLATE, FERRITIN, TIBC, IRON, RETICCTPCT in the last 72 hours. Sepsis Labs: No results for input(s): PROCALCITON, LATICACIDVEN in the last 168 hours.  Recent Results (from the past 240 hour(s))  Resp Panel by RT-PCR (Flu A&B, Covid) Nasopharyngeal Swab     Status: Abnormal   Collection Time: 02/02/21 10:09 AM   Specimen: Nasopharyngeal Swab; Nasopharyngeal(NP) swabs in vial transport medium  Result Value Ref Range Status   SARS Coronavirus 2 by RT PCR NEGATIVE NEGATIVE Final    Comment: (NOTE) SARS-CoV-2 target nucleic acids are NOT DETECTED.  The SARS-CoV-2 RNA is generally detectable in upper respiratory specimens during the acute phase of infection. The lowest concentration of SARS-CoV-2 viral copies this assay can detect is 138 copies/mL. A negative result does not  preclude SARS-Cov-2 infection and should not be used as the sole basis for treatment or other patient management decisions. A negative result may occur with  improper specimen collection/handling, submission of specimen other than nasopharyngeal swab, presence of viral mutation(s) within the areas targeted by this assay, and inadequate number of viral copies(<138 copies/mL). A negative result must be combined with clinical observations, patient history, and epidemiological information. The expected result is Negative.  Fact Sheet for Patients:  BloggerCourse.com  Fact Sheet for Healthcare Providers:  SeriousBroker.it  This test is no t yet approved or cleared by the [place] FDA and  has been authorized for detection and/or diagnosis of SARS-CoV-2 by FDA under an Emergency Use Authorization (EUA). This EUA will remain  in effect (meaning this test can be used) for the duration of the COVID-19 declaration under Section 564(b)(1) of the Act, 21 U.S.C.section 360bbb-3(b)(1), unless the authorization is terminated  or revoked  sooner.       Influenza A by PCR POSITIVE (A) NEGATIVE Final   Influenza B by PCR NEGATIVE NEGATIVE Final    Comment: (NOTE) The Xpert Xpress SARS-CoV-2/FLU/RSV plus assay is intended as an aid in the diagnosis of influenza from Nasopharyngeal swab specimens and should not be used as a sole basis for treatment. Nasal washings and aspirates are unacceptable for Xpert Xpress SARS-CoV-2/FLU/RSV testing.  Fact Sheet for Patients: BloggerCourse.com  Fact Sheet for Healthcare Providers: SeriousBroker.it  This test is not yet approved or cleared by the [place] FDA and has been authorized for detection and/or diagnosis of SARS-CoV-2 by FDA under an Emergency Use Authorization (EUA). This EUA will remain in effect (meaning this test can be used) for the duration of the COVID-19 declaration under Section 564(b)(1) of the Act, 21 U.S.C. section 360bbb-3(b)(1), unless the authorization is terminated or revoked.  Performed at [facility], [address], [place], [place] [address]      Radiology Studies: DG Chest Port 1 View  Result Date: 02/02/2021 CLINICAL DATA:  Shortness of breath and wheezing EXAM: PORTABLE CHEST 1 VIEW COMPARISON:  Portable exam 1018 hours compared to 05/10/2016 FINDINGS: Normal heart size, mediastinal contours, and pulmonary vascularity. Atherosclerotic calcification aorta. Lungs hyperinflated but clear. No pulmonary infiltrate, pleural effusion, or pneumothorax. Osseous structures unremarkable. IMPRESSION: Hyperinflated lungs without acute infiltrate. Aortic Atherosclerosis (ICD10-I70.0). Electronically Signed   By: Ulyses Southward M.D.   On: 02/02/2021 10:24     LOS: 0 days   Lanae Boast, MD [facility]  02/03/2021, 12:12 PM

## 2021-02-04 ENCOUNTER — Inpatient Hospital Stay: Payer: BC Managed Care – PPO

## 2021-02-04 DIAGNOSIS — J9601 Acute respiratory failure with hypoxia: Secondary | ICD-10-CM | POA: Diagnosis not present

## 2021-02-04 LAB — CBC
HCT: 36.6 % — ABNORMAL LOW (ref 39.0–52.0)
Hemoglobin: 11.1 g/dL — ABNORMAL LOW (ref 13.0–17.0)
MCH: 23.5 pg — ABNORMAL LOW (ref 26.0–34.0)
MCHC: 30.3 g/dL (ref 30.0–36.0)
MCV: 77.5 fL — ABNORMAL LOW (ref 80.0–100.0)
Platelets: 113 10*3/uL — ABNORMAL LOW (ref 150–400)
RBC: 4.72 MIL/uL (ref 4.22–5.81)
WBC: 4.8 10*3/uL (ref 4.0–10.5)
nRBC: 0 % (ref 0.0–0.2)

## 2021-02-04 MED ORDER — GUAIFENESIN-DM 100-10 MG/5ML PO SYRP: 5.0000 mL | ORAL_SOLUTION | ORAL | 0 refills | Status: AC | PRN

## 2021-02-04 MED ORDER — OSELTAMIVIR PHOSPHATE 75 MG PO CAPS: 75.0000 mg | ORAL_CAPSULE | Freq: Two times a day (BID) | ORAL | 0 refills | Status: AC

## 2021-02-04 MED ORDER — PREDNISONE 20 MG PO TABS: 20.0000 mg | ORAL_TABLET | Freq: Every day | ORAL | 0 refills | Status: DC

## 2021-02-04 NOTE — Discharge Summary (Signed)
Physician Discharge Summary  Bob Scott LKG:[id number] DOB: Sep 06, 1945 DOA: 02/02/2021  PCP: Oneita Hurt, No  Admit date: 02/02/2021 Discharge date: 02/04/2021  Admitted From: home Disposition:  home  Recommendations for Outpatient Follow-up:  Follow up with PCP in 1-2 weeks Please obtain BMP/CBC in one week  Home Health:no  Equipment/Devices: none  Discharge Condition: Stable Code Status:   Code Status: Full Code Diet recommendation:  Diet Order             Diet Carb Modified Fluid consistency: Thin; Room service appropriate? Yes  Diet effective now                   Brief Narrative/Hospital Course: [name], 75 y.o. male with PMH of IDA,peripheral neuropathy, tobacco abuse who presented to ED with 3 day history of cough and shortness of breath. Seen in the ED found to be hypoxic 88% on room air needing 2 L nasal cannula tachypneic 24 afebrile, labs with positive for influenza A thrombocytopenia AST 50, PCO2 66, chest x-ray hyperinflated lungs without acute infiltrate, patient was placed on bronchodilators Solu-Medrol Tamiflu and admitted. Patient was managed with antitussives steroid Tamiflu supplemental oxygen.  He is already improved at this time doing much better able to ambulate.  He was able to cough oxygen and doing well on room air.  Is medically stable for discharge home with outpatient follow-up with his PCP plan of care discussed with patient's son at the bedside. Discharge Diagnoses:  Acute respiratory failure with hypoxia due to influenza Influenza A infection: Hypoxia resolved.  Doing well on room air.  Continue ongoing supportive management I antitussives, Tamiflu, follow-up with PCP continue steroid for few more days  Peripheral neuropathy: Stable continue home meds Iron deficiency anemia: Monitor hemoglobin iron infusion on 10/15 and 10/22.  Followed by hematology and has been referred to GI Thrombocytopenia : No previous labs available recent B12 normal ID  could contribute.  Monitor closely could be in the setting of acute viral infection. Cbc in 1 wk Tobacco abuse we discussed cessation BPH continue Flomax. Pressure Ulcer:    Consults: none  Subjective: Alert awake oriented not in distress able to come off o2 and ambulate.  No weakness no chest pain.  Eating well.  Wants to go home today Discharge Exam: Vitals:   02/04/21 0403 02/04/21 0834  BP: 125/77   Pulse: (!) 58 69  Resp: 18 18  Temp: 98.5 F (36.9 C)   SpO2: 100% 96%   General: Pt is alert, awake, not in acute distress Cardiovascular: RRR, S1/S2 +, no rubs, no gallops Respiratory: CTA bilaterally, no wheezing, no rhonchi Abdominal: Soft, NT, ND, bowel sounds + Extremities: no edema, no cyanosis  Discharge Instructions  Discharge Instructions     Discharge instructions   Complete by: As directed    Please call call MD or return to ER for similar or worsening recurring problem that brought you to hospital or if any fever,nausea/vomiting,abdominal pain, uncontrolled pain, chest pain,  shortness of breath or any other alarming symptoms.  Please follow-up your doctor as instructed in a week time and call the office for appointment.  Please avoid alcohol, smoking, or any other illicit substance and maintain healthy habits including taking your regular medications as prescribed.  You were cared for by a hospitalist during your hospital stay. If you have any questions about your discharge medications or the care you received while you were in the hospital after you are discharged, you can call the unit and  ask to speak with the hospitalist on call if the hospitalist that took care of you is not available.  Once you are discharged, your primary care physician will handle any further medical issues. Please note that NO REFILLS for any discharge medications will be authorized once you are discharged, as it is imperative that you return to your primary care physician (or establish  a relationship with a primary care physician if you do not have one) for your aftercare needs so that they can reassess your need for medications and monitor your lab values   Increase activity slowly   Complete by: As directed       Allergies as of 02/04/2021   No Known Allergies      Medication List     STOP taking these medications    ibuprofen 200 MG tablet Commonly known as: ADVIL   meloxicam 7.5 MG tablet Commonly known as: MOBIC       TAKE these medications    azelastine 0.05 % ophthalmic solution Commonly known as: OPTIVAR Place 1 drop into both eyes 2 (two) times daily.   DULoxetine 30 MG capsule Commonly known as: CYMBALTA TAKE 1 CAPSULE BY MOUTH EVERY DAY What changed: how much to take   fluticasone 50 MCG/ACT nasal spray Commonly known as: FLONASE Place 2 sprays into both nostrils daily.   gabapentin 100 MG capsule Commonly known as: NEURONTIN Take 1-2 capsules (100-200 mg total) by mouth at bedtime.   guaiFENesin-dextromethorphan 100-10 MG/5ML syrup Commonly known as: ROBITUSSIN DM Take 5 mLs by mouth every 4 (four) hours as needed for cough.   Iron (Ferrous Sulfate) 325 (65 Fe) MG Tabs Take 325 mg by mouth daily.   oseltamivir 75 MG capsule Commonly known as: TAMIFLU Take 1 capsule (75 mg total) by mouth 2 (two) times daily for 3 days.   predniSONE 20 MG tablet Commonly known as: DELTASONE Take 1 tablet (20 mg total) by mouth daily with breakfast for 4 days. Start taking on: February 05, 2021   tamsulosin 0.4 MG Caps capsule Commonly known as: FLOMAX Take 1 capsule (0.4 mg total) by mouth daily.        Follow-up Information     [facility] Follow up in 1 week(s).   Contact information: [address] [place] [address] [phone]               No Known Allergies  The results of significant diagnostics from this hospitalization (including imaging, microbiology,  ancillary and laboratory) are listed below for reference.    Microbiology: Recent Results (from the past 240 hour(s))  Resp Panel by RT-PCR (Flu A&B, Covid) Nasopharyngeal Swab     Status: Abnormal   Collection Time: 02/02/21 10:09 AM   Specimen: Nasopharyngeal Swab; Nasopharyngeal(NP) swabs in vial transport medium  Result Value Ref Range Status   SARS Coronavirus 2 by RT PCR NEGATIVE NEGATIVE Final    Comment: (NOTE) SARS-CoV-2 target nucleic acids are NOT DETECTED.  The SARS-CoV-2 RNA is generally detectable in upper respiratory specimens during the acute phase of infection. The lowest concentration of SARS-CoV-2 viral copies this assay can detect is 138 copies/mL. A negative result does not preclude SARS-Cov-2 infection and should not be used as the sole basis for treatment or other patient management decisions. A negative result may occur with  improper specimen collection/handling, submission of specimen other than nasopharyngeal swab, presence of viral mutation(s) within the areas targeted by this assay, and inadequate number  of viral copies(<138 copies/mL). A negative result must be combined with clinical observations, patient history, and epidemiological information. The expected result is Negative.  Fact Sheet for Patients:  BloggerCourse.com  Fact Sheet for Healthcare Providers:  SeriousBroker.it  This test is no t yet approved or cleared by the [place] FDA and  has been authorized for detection and/or diagnosis of SARS-CoV-2 by FDA under an Emergency Use Authorization (EUA). This EUA will remain  in effect (meaning this test can be used) for the duration of the COVID-19 declaration under Section 564(b)(1) of the Act, 21 U.S.C.section 360bbb-3(b)(1), unless the authorization is terminated  or revoked sooner.       Influenza A by PCR POSITIVE (A) NEGATIVE Final   Influenza B by PCR NEGATIVE NEGATIVE Final     Comment: (NOTE) The Xpert Xpress SARS-CoV-2/FLU/RSV plus assay is intended as an aid in the diagnosis of influenza from Nasopharyngeal swab specimens and should not be used as a sole basis for treatment. Nasal washings and aspirates are unacceptable for Xpert Xpress SARS-CoV-2/FLU/RSV testing.  Fact Sheet for Patients: BloggerCourse.com  Fact Sheet for Healthcare Providers: SeriousBroker.it  This test is not yet approved or cleared by the [place] FDA and has been authorized for detection and/or diagnosis of SARS-CoV-2 by FDA under an Emergency Use Authorization (EUA). This EUA will remain in effect (meaning this test can be used) for the duration of the COVID-19 declaration under Section 564(b)(1) of the Act, 21 U.S.C. section 360bbb-3(b)(1), unless the authorization is terminated or revoked.  Performed at [facility], [address], [place], [place] [address]     Procedures/Studies: DG Chest Port 1 View  Result Date: 02/02/2021 CLINICAL DATA:  Shortness of breath and wheezing EXAM: PORTABLE CHEST 1 VIEW COMPARISON:  Portable exam 1018 hours compared to 05/10/2016 FINDINGS: Normal heart size, mediastinal contours, and pulmonary vascularity. Atherosclerotic calcification aorta. Lungs hyperinflated but clear. No pulmonary infiltrate, pleural effusion, or pneumothorax. Osseous structures unremarkable. IMPRESSION: Hyperinflated lungs without acute infiltrate. Aortic Atherosclerosis (ICD10-I70.0). Electronically Signed   By: Ulyses Southward M.D.   On: 02/02/2021 10:24    Labs: BNP (last 3 results) No results for input(s): BNP in the last 8760 hours. Basic Metabolic Panel: Recent Labs  Lab 02/02/21 1022 02/02/21 1030 02/03/21 0244  NA 136 137 136  K 3.7 3.6 4.2  CL 98  --  103  CO2 30  --  27  GLUCOSE 132*  --  126*  BUN 7*  --  10  CREATININE 0.65  --  0.59*  CALCIUM 9.0  --  8.4*   Liver Function Tests: Recent  Labs  Lab 02/02/21 1022 02/03/21 0244  AST 50* 39  ALT 35 27  ALKPHOS 59 49  BILITOT 0.8 0.4  PROT 8.1 6.5  ALBUMIN 4.0 3.1*   No results for input(s): LIPASE, AMYLASE in the last 168 hours. No results for input(s): AMMONIA in the last 168 hours. CBC: Recent Labs  Lab 02/02/21 1022 02/02/21 1030 02/03/21 0244 02/04/21 0506  WBC 6.7  --  4.2 4.8  HGB 13.0 13.6 11.1* 11.1*  HCT 42.3 40.0 35.8* 36.6*  MCV 77.9*  --  77.7* 77.5*  PLT 102*  --  103* 113*   Cardiac Enzymes: No results for input(s): CKTOTAL, CKMB, CKMBINDEX, TROPONINI in the last 168 hours. BNP: Invalid input(s): POCBNP CBG: No results for input(s): GLUCAP in the last 168 hours. D-Dimer No results for input(s): DDIMER in the last 72 hours. Hgb A1c No  results for input(s): HGBA1C in the last 72 hours. Lipid Profile No results for input(s): CHOL, HDL, LDLCALC, TRIG, CHOLHDL, LDLDIRECT in the last 72 hours. Thyroid function studies No results for input(s): TSH, T4TOTAL, T3FREE, THYROIDAB in the last 72 hours.  Invalid input(s): FREET3 Anemia work up No results for input(s): VITAMINB12, FOLATE, FERRITIN, TIBC, IRON, RETICCTPCT in the last 72 hours. Urinalysis    Component Value Date/Time   BILIRUBINUR negative 02/25/2015 1655   KETONESUR negative 02/25/2015 1655   PROTEINUR negative 02/25/2015 1655   UROBILINOGEN 0.2 02/25/2015 1655   NITRITE Negative 02/25/2015 1655   LEUKOCYTESUR Negative 02/25/2015 1655   Sepsis Labs Invalid input(s): PROCALCITONIN,  WBC,  LACTICIDVEN Microbiology Recent Results (from the past 240 hour(s))  Resp Panel by RT-PCR (Flu A&B, Covid) Nasopharyngeal Swab     Status: Abnormal   Collection Time: 02/02/21 10:09 AM   Specimen: Nasopharyngeal Swab; Nasopharyngeal(NP) swabs in vial transport medium  Result Value Ref Range Status   SARS Coronavirus 2 by RT PCR NEGATIVE NEGATIVE Final    Comment: (NOTE) SARS-CoV-2 target nucleic acids are NOT DETECTED.  The SARS-CoV-2 RNA  is generally detectable in upper respiratory specimens during the acute phase of infection. The lowest concentration of SARS-CoV-2 viral copies this assay can detect is 138 copies/mL. A negative result does not preclude SARS-Cov-2 infection and should not be used as the sole basis for treatment or other patient management decisions. A negative result may occur with  improper specimen collection/handling, submission of specimen other than nasopharyngeal swab, presence of viral mutation(s) within the areas targeted by this assay, and inadequate number of viral copies(<138 copies/mL). A negative result must be combined with clinical observations, patient history, and epidemiological information. The expected result is Negative.  Fact Sheet for Patients:  BloggerCourse.com  Fact Sheet for Healthcare Providers:  SeriousBroker.it  This test is no t yet approved or cleared by the [place] FDA and  has been authorized for detection and/or diagnosis of SARS-CoV-2 by FDA under an Emergency Use Authorization (EUA). This EUA will remain  in effect (meaning this test can be used) for the duration of the COVID-19 declaration under Section 564(b)(1) of the Act, 21 U.S.C.section 360bbb-3(b)(1), unless the authorization is terminated  or revoked sooner.       Influenza A by PCR POSITIVE (A) NEGATIVE Final   Influenza B by PCR NEGATIVE NEGATIVE Final    Comment: (NOTE) The Xpert Xpress SARS-CoV-2/FLU/RSV plus assay is intended as an aid in the diagnosis of influenza from Nasopharyngeal swab specimens and should not be used as a sole basis for treatment. Nasal washings and aspirates are unacceptable for Xpert Xpress SARS-CoV-2/FLU/RSV testing.  Fact Sheet for Patients: BloggerCourse.com  Fact Sheet for Healthcare Providers: SeriousBroker.it  This test is not yet approved or cleared by the  [place] FDA and has been authorized for detection and/or diagnosis of SARS-CoV-2 by FDA under an Emergency Use Authorization (EUA). This EUA will remain in effect (meaning this test can be used) for the duration of the COVID-19 declaration under Section 564(b)(1) of the Act, 21 U.S.C. section 360bbb-3(b)(1), unless the authorization is terminated or revoked.  Performed at [facility], [address], [place], [place] [address]      Time coordinating discharge: 25 minutes  SIGNED: Lanae Boast, MD  [facility] 02/04/2021, 12:54 PM  If 7PM-7AM, please contact night-coverage [email or web address]

## 2021-02-04 NOTE — Progress Notes (Signed)
SATURATION QUALIFICATIONS: (This note is used to comply with regulatory documentation for home oxygen)  Patient Saturations on Room Air at Rest = 96%  Patient Saturations on Room Air while Ambulating = 91%  Patient was able to maintain saturations on room air and did not require any supplemental oxygen

## 2021-02-09 ENCOUNTER — Other Ambulatory Visit: Payer: Self-pay

## 2021-02-09 ENCOUNTER — Encounter (HOSPITAL_COMMUNITY): Payer: Self-pay | Admitting: *Deleted

## 2021-02-09 ENCOUNTER — Observation Stay (HOSPITAL_COMMUNITY): Payer: BC Managed Care – PPO

## 2021-02-09 ENCOUNTER — Inpatient Hospital Stay (HOSPITAL_COMMUNITY)
Admission: EM | Admit: 2021-02-09 | Discharge: 2021-02-13 | DRG: 190 | Disposition: A | Discharge status: Home / Self Care | Payer: BC Managed Care – PPO | Attending: Internal Medicine | Admitting: Internal Medicine

## 2021-02-09 ENCOUNTER — Ambulatory Visit (INDEPENDENT_AMBULATORY_CARE_PROVIDER_SITE_OTHER): Payer: BC Managed Care – PPO

## 2021-02-09 ENCOUNTER — Encounter (HOSPITAL_COMMUNITY): Payer: Self-pay

## 2021-02-09 ENCOUNTER — Ambulatory Visit (HOSPITAL_COMMUNITY)
Admission: EM | Admit: 2021-02-09 | Discharge: 2021-02-09 | Disposition: A | Discharge status: Home / Self Care | Payer: BC Managed Care – PPO | Attending: Emergency Medicine | Admitting: Emergency Medicine

## 2021-02-09 DIAGNOSIS — J1000 Influenza due to other identified influenza virus with unspecified type of pneumonia: Secondary | ICD-10-CM | POA: Diagnosis present

## 2021-02-09 DIAGNOSIS — J101 Influenza due to other identified influenza virus with other respiratory manifestations: Secondary | ICD-10-CM | POA: Diagnosis not present

## 2021-02-09 DIAGNOSIS — G629 Polyneuropathy, unspecified: Secondary | ICD-10-CM | POA: Diagnosis present

## 2021-02-09 DIAGNOSIS — D509 Iron deficiency anemia, unspecified: Secondary | ICD-10-CM | POA: Diagnosis present

## 2021-02-09 DIAGNOSIS — J438 Other emphysema: Secondary | ICD-10-CM | POA: Diagnosis not present

## 2021-02-09 DIAGNOSIS — J441 Chronic obstructive pulmonary disease with (acute) exacerbation: Secondary | ICD-10-CM | POA: Diagnosis not present

## 2021-02-09 DIAGNOSIS — N400 Enlarged prostate without lower urinary tract symptoms: Secondary | ICD-10-CM | POA: Diagnosis present

## 2021-02-09 DIAGNOSIS — R0602 Shortness of breath: Secondary | ICD-10-CM

## 2021-02-09 DIAGNOSIS — Z79899 Other long term (current) drug therapy: Secondary | ICD-10-CM

## 2021-02-09 DIAGNOSIS — J440 Chronic obstructive pulmonary disease with acute lower respiratory infection: Secondary | ICD-10-CM

## 2021-02-09 DIAGNOSIS — F17210 Nicotine dependence, cigarettes, uncomplicated: Secondary | ICD-10-CM | POA: Diagnosis present

## 2021-02-09 DIAGNOSIS — R0902 Hypoxemia: Principal | ICD-10-CM

## 2021-02-09 DIAGNOSIS — E860 Dehydration: Secondary | ICD-10-CM | POA: Diagnosis present

## 2021-02-09 DIAGNOSIS — J449 Chronic obstructive pulmonary disease, unspecified: Secondary | ICD-10-CM | POA: Diagnosis not present

## 2021-02-09 DIAGNOSIS — Z20822 Contact with and (suspected) exposure to covid-19: Secondary | ICD-10-CM | POA: Diagnosis present

## 2021-02-09 DIAGNOSIS — J9601 Acute respiratory failure with hypoxia: Secondary | ICD-10-CM | POA: Diagnosis present

## 2021-02-09 DIAGNOSIS — E861 Hypovolemia: Secondary | ICD-10-CM | POA: Diagnosis present

## 2021-02-09 DIAGNOSIS — J209 Acute bronchitis, unspecified: Secondary | ICD-10-CM

## 2021-02-09 DIAGNOSIS — J189 Pneumonia, unspecified organism: Secondary | ICD-10-CM

## 2021-02-09 LAB — I-STAT VENOUS BLOOD GAS, ED
Acid-Base Excess: 6 mmol/L — ABNORMAL HIGH (ref 0.0–2.0)
Bicarbonate: 32.6 mmol/L — ABNORMAL HIGH (ref 20.0–28.0)
Calcium, Ion: 1.13 mmol/L — ABNORMAL LOW (ref 1.15–1.40)
HCT: 45 % (ref 39.0–52.0)
Hemoglobin: 15.3 g/dL (ref 13.0–17.0)
O2 Saturation: 91 %
Potassium: 3.9 mmol/L (ref 3.5–5.1)
Sodium: 138 mmol/L (ref 135–145)
TCO2: 34 mmol/L — ABNORMAL HIGH (ref 22–32)
pCO2, Ven: 54.1 mmHg (ref 44.0–60.0)
pH, Ven: 7.388 (ref 7.250–7.430)
pO2, Ven: 64 mmHg — ABNORMAL HIGH (ref 32.0–45.0)

## 2021-02-09 LAB — CBC WITH DIFFERENTIAL/PLATELET
Abs Immature Granulocytes: 0.02 10*3/uL (ref 0.00–0.07)
Basophils Absolute: 0 10*3/uL (ref 0.0–0.1)
Basophils Relative: 0 %
Eosinophils Absolute: 0 10*3/uL (ref 0.0–0.5)
Eosinophils Relative: 0 %
HCT: 44.3 % (ref 39.0–52.0)
Hemoglobin: 14 g/dL (ref 13.0–17.0)
Immature Granulocytes: 0 %
Lymphocytes Relative: 28 %
Lymphs Abs: 2.5 10*3/uL (ref 0.7–4.0)
MCH: 24.1 pg — ABNORMAL LOW (ref 26.0–34.0)
MCHC: 31.6 g/dL (ref 30.0–36.0)
MCV: 76.2 fL — ABNORMAL LOW (ref 80.0–100.0)
Monocytes Absolute: 1.3 10*3/uL — ABNORMAL HIGH (ref 0.1–1.0)
Monocytes Relative: 14 %
Neutro Abs: 5.2 10*3/uL (ref 1.7–7.7)
Neutrophils Relative %: 58 %
Platelets: 200 10*3/uL (ref 150–400)
RBC: 5.81 MIL/uL (ref 4.22–5.81)
RDW: 29 % — ABNORMAL HIGH (ref 11.5–15.5)
WBC: 9 10*3/uL (ref 4.0–10.5)
nRBC: 0 % (ref 0.0–0.2)

## 2021-02-09 LAB — COMPREHENSIVE METABOLIC PANEL
ALT: 31 U/L (ref 0–44)
AST: 26 U/L (ref 15–41)
Albumin: 3.5 g/dL (ref 3.5–5.0)
Alkaline Phosphatase: 69 U/L (ref 38–126)
Anion gap: 9 (ref 5–15)
BUN: 8 mg/dL (ref 8–23)
CO2: 30 mmol/L (ref 22–32)
Calcium: 9.5 mg/dL (ref 8.9–10.3)
Chloride: 99 mmol/L (ref 98–111)
Creatinine, Ser: 0.65 mg/dL (ref 0.61–1.24)
GFR, Estimated: >60 mL/min (ref >60)
Glucose, Bld: 100 mg/dL — ABNORMAL HIGH (ref 70–99)
Potassium: 3.9 mmol/L (ref 3.5–5.1)
Sodium: 138 mmol/L (ref 135–145)
Total Bilirubin: 0.5 mg/dL (ref 0.3–1.2)
Total Protein: 8.3 g/dL — ABNORMAL HIGH (ref 6.5–8.1)

## 2021-02-09 LAB — RESP PANEL BY RT-PCR (FLU A&B, COVID) ARPGX2
Influenza A by PCR: POSITIVE — AB
Influenza B by PCR: NEGATIVE
SARS Coronavirus 2 by RT PCR: NEGATIVE

## 2021-02-09 LAB — BRAIN NATRIURETIC PEPTIDE: B Natriuretic Peptide: 23.7 pg/mL (ref 0.0–100.0)

## 2021-02-09 MED ORDER — DOXYCYCLINE HYCLATE 100 MG PO TABS: 100.0000 mg | ORAL_TABLET | Freq: Two times a day (BID) | ORAL | Status: DC

## 2021-02-09 MED ORDER — FLUTICASONE FUROATE-VILANTEROL 100-25 MCG/ACT IN AEPB
1.0000 | INHALATION_SPRAY | Freq: Every day | RESPIRATORY_TRACT | Status: DC
  Administered 2021-02-10 – 2021-02-13 (×4): 1 via RESPIRATORY_TRACT
  Filled 2021-02-09: qty 28

## 2021-02-09 MED ORDER — DOXYCYCLINE HYCLATE 100 MG PO TABS
100.0000 mg | ORAL_TABLET | Freq: Two times a day (BID) | ORAL | Status: DC
  Administered 2021-02-10 – 2021-02-13 (×7): 100 mg via ORAL
  Filled 2021-02-09 (×7): qty 1

## 2021-02-09 MED ORDER — IPRATROPIUM BROMIDE 0.02 % IN SOLN
0.5000 mg | Freq: Once | RESPIRATORY_TRACT | Status: AC
  Administered 2021-02-09: 0.5 mg via RESPIRATORY_TRACT
  Filled 2021-02-09: qty 2.5

## 2021-02-09 MED ORDER — FERROUS SULFATE 325 (65 FE) MG PO TABS
325.0000 mg | ORAL_TABLET | Freq: Every day | ORAL | Status: DC
  Administered 2021-02-10 – 2021-02-13 (×4): 325 mg via ORAL
  Filled 2021-02-09 (×4): qty 1

## 2021-02-09 MED ORDER — ORAL CARE MOUTH RINSE
15.0000 mL | Freq: Two times a day (BID) | OROMUCOSAL | Status: DC
  Administered 2021-02-09 – 2021-02-13 (×8): 15 mL via OROMUCOSAL

## 2021-02-09 MED ORDER — ALBUTEROL SULFATE (2.5 MG/3ML) 0.083% IN NEBU: 2.5000 mg | INHALATION_SOLUTION | RESPIRATORY_TRACT | Status: DC | PRN

## 2021-02-09 MED ORDER — PREDNISONE 20 MG PO TABS
40.0000 mg | ORAL_TABLET | Freq: Every day | ORAL | Status: DC
  Administered 2021-02-10: 40 mg via ORAL
  Filled 2021-02-09: qty 2

## 2021-02-09 MED ORDER — BUDESONIDE 0.25 MG/2ML IN SUSP: 0.2500 mg | Freq: Two times a day (BID) | RESPIRATORY_TRACT | 2 refills | Status: AC

## 2021-02-09 MED ORDER — METHYLPREDNISOLONE SODIUM SUCC 125 MG IJ SOLR
125.0000 mg | Freq: Once | INTRAMUSCULAR | Status: AC
  Administered 2021-02-09: 125 mg via INTRAVENOUS
  Filled 2021-02-09: qty 2

## 2021-02-09 MED ORDER — SODIUM CHLORIDE 0.9 % IV SOLN
1.0000 g | Freq: Once | INTRAVENOUS | Status: AC
  Administered 2021-02-09: 1 g via INTRAVENOUS
  Filled 2021-02-09: qty 10

## 2021-02-09 MED ORDER — IPRATROPIUM-ALBUTEROL 0.5-2.5 (3) MG/3ML IN SOLN
3.0000 mL | Freq: Four times a day (QID) | RESPIRATORY_TRACT | Status: DC
  Administered 2021-02-09: 3 mL via RESPIRATORY_TRACT
  Filled 2021-02-09: qty 3

## 2021-02-09 MED ORDER — GUAIFENESIN-DM 100-10 MG/5ML PO SYRP: 5.0000 mL | ORAL_SOLUTION | ORAL | Status: DC | PRN

## 2021-02-09 MED ORDER — NICOTINE 7 MG/24HR TD PT24
7.0000 mg | MEDICATED_PATCH | Freq: Every day | TRANSDERMAL | Status: DC
  Administered 2021-02-10 – 2021-02-13 (×4): 7 mg via TRANSDERMAL
  Filled 2021-02-09 (×5): qty 1

## 2021-02-09 MED ORDER — ALBUTEROL SULFATE (2.5 MG/3ML) 0.083% IN NEBU: 2.5000 mg | INHALATION_SOLUTION | RESPIRATORY_TRACT | 2 refills | Status: AC | PRN

## 2021-02-09 MED ORDER — ALBUTEROL SULFATE (2.5 MG/3ML) 0.083% IN NEBU
5.0000 mg | INHALATION_SOLUTION | Freq: Once | RESPIRATORY_TRACT | Status: AC
  Administered 2021-02-09: 5 mg via RESPIRATORY_TRACT
  Filled 2021-02-09: qty 6

## 2021-02-09 MED ORDER — AZITHROMYCIN 250 MG PO TABS
500.0000 mg | ORAL_TABLET | Freq: Once | ORAL | Status: AC
  Administered 2021-02-09: 500 mg via ORAL
  Filled 2021-02-09: qty 2

## 2021-02-09 MED ORDER — ALBUTEROL SULFATE HFA 108 (90 BASE) MCG/ACT IN AERS: 2.0000 | INHALATION_SPRAY | Freq: Four times a day (QID) | RESPIRATORY_TRACT | 2 refills | Status: AC | PRN

## 2021-02-09 MED ORDER — TAMSULOSIN HCL 0.4 MG PO CAPS
0.4000 mg | ORAL_CAPSULE | Freq: Every day | ORAL | Status: DC
  Administered 2021-02-10 – 2021-02-13 (×4): 0.4 mg via ORAL
  Filled 2021-02-09 (×4): qty 1

## 2021-02-09 MED ORDER — DULOXETINE HCL 30 MG PO CPEP
30.0000 mg | ORAL_CAPSULE | Freq: Every day | ORAL | Status: DC
  Administered 2021-02-09 – 2021-02-13 (×5): 30 mg via ORAL
  Filled 2021-02-09 (×5): qty 1

## 2021-02-09 MED ORDER — ENOXAPARIN SODIUM 40 MG/0.4ML IJ SOSY
40.0000 mg | PREFILLED_SYRINGE | INTRAMUSCULAR | Status: DC
  Administered 2021-02-10 – 2021-02-12 (×3): 40 mg via SUBCUTANEOUS
  Filled 2021-02-09 (×3): qty 0.4

## 2021-02-09 NOTE — ED Triage Notes (Signed)
SPO2 86% on RA . Pt started on Nasal O2 @ 2 liters.

## 2021-02-09 NOTE — ED Triage Notes (Signed)
Pt presents with c/o headaches, breathing fast X 1 month.

## 2021-02-09 NOTE — ED Triage Notes (Signed)
Patient was transferred from [facility] for eval. Of pneumonia , c/o sob.  Onset 1 month ago.

## 2021-02-09 NOTE — ED Notes (Signed)
Called lab to add on BNP. Lab to add on BNP

## 2021-02-09 NOTE — H&P (Signed)
History and Physical    Bob Scott ERD:[id number] DOB: 09-26-1945 DOA: 02/09/2021  PCP: Pcp, No (Confirm with patient/family/NH records and if not entered, this has to be entered at [facility] point of entry) Patient coming from: Home  I have personally briefly reviewed patient's old medical records in [facility]  Chief Complaint: SOB  HPI: Bob Scott is a 75 y.o. male with medical history significant of BPH, questionable COPD, recent influenza A infection, chronic iron deficiency anemia, chronic peripheral neuropathy, tobacco abuse, presented with worsening of dry cough, shortness of breath.  Patient was recently hospitalized for acute hypoxic respite failure secondary to influenza A infection, was discharged home 4 days ago after hypoxia resolved.  But increasingly more, patient again developed shortness of breath, worsening of dry cough and wheezing.  No fever chills no chest pains.  Patient went to work today, but again developed significant shortness of breath and went to urgent care was found O2 saturation 86% on room air sent to the ED.  ED Course: O2 saturation 82% on room air.  Stabilized on 4 L.  Chest x-ray showed questionable right middle lobe pneumonia, ED started patient on ceftriaxone and azithromycin.  WBC 9.0, K3.9, creatinine 0.6.  Review of Systems: As per HPI otherwise 14 point review of systems negative.    Past Medical History:  Diagnosis Date   Allergy    Varicose vein     History reviewed. No pertinent surgical history.   reports that he has been smoking cigarettes. He has never used smokeless tobacco. He reports that he does not currently use alcohol. He reports that he does not use drugs.  No Known Allergies  No family history on file.   Prior to Admission medications   Medication Sig Start Date End Date Taking? Authorizing Provider  albuterol (PROVENTIL) (2.5 MG/3ML) 0.083% nebulizer solution Take 3 mLs (2.5 mg total) by nebulization every 4 (four)  hours as needed for wheezing or shortness of breath. 02/09/21 02/09/22 Yes Anvitha Hutmacher, Renae Fickle, MD  albuterol (VENTOLIN HFA) 108 (90 Base) MCG/ACT inhaler Inhale 2 puffs into the lungs every 6 (six) hours as needed for wheezing or shortness of breath. 02/09/21  Yes Virgilio Broadhead, Ilda Foil T, MD  budesonide (PULMICORT) 0.25 MG/2ML nebulizer solution Take 2 mLs (0.25 mg total) by nebulization 2 (two) times daily. 02/09/21 02/09/22 Yes Daryle Boyington, Renae Fickle, MD  azelastine (OPTIVAR) 0.05 % ophthalmic solution Place 1 drop into both eyes 2 (two) times daily. Patient not taking: Reported on 02/02/2021 07/13/19   Shade Flood, MD  DULoxetine (CYMBALTA) 30 MG capsule TAKE 1 CAPSULE BY MOUTH EVERY DAY Patient taking differently: Take 30 mg by mouth daily. 12/15/20   Hoy Register, MD  fluticasone (FLONASE) 50 MCG/ACT nasal spray Place 2 sprays into both nostrils daily. Patient not taking: Reported on 02/02/2021 07/13/19   Shade Flood, MD  gabapentin (NEURONTIN) 100 MG capsule Take 1-2 capsules (100-200 mg total) by mouth at bedtime. Patient not taking: Reported on 02/02/2021 07/13/19   Shade Flood, MD  guaiFENesin-dextromethorphan [facility] DM) 100-10 MG/5ML syrup Take 5 mLs by mouth every 4 (four) hours as needed for cough. 02/04/21   Lanae Boast, MD  Iron, Ferrous Sulfate, 325 (65 Fe) MG TABS Take 325 mg by mouth daily. 12/14/20   Hoy Register, MD  predniSONE (DELTASONE) 20 MG tablet Take 1 tablet (20 mg total) by mouth daily with breakfast for 4 days. 02/05/21 02/09/21  Lanae Boast, MD  tamsulosin (FLOMAX) 0.4 MG CAPS capsule Take  1 capsule (0.4 mg total) by mouth daily. 11/22/20   Hoy Register, MD    Physical Exam: Vitals:   02/09/21 1406 02/09/21 1408 02/09/21 1414 02/09/21 1415  BP:    129/75  Pulse:  83 73 82  Resp:  (!) 28 17 (!) 24  Temp:      TempSrc:      SpO2: 94% 93% 93% (!) 88%  Weight:      Height:        Constitutional: NAD, calm, comfortable Vitals:   02/09/21 1406 02/09/21 1408 02/09/21 1414  02/09/21 1415  BP:    129/75  Pulse:  83 73 82  Resp:  (!) 28 17 (!) 24  Temp:      TempSrc:      SpO2: 94% 93% 93% (!) 88%  Weight:      Height:       Eyes: PERRL, lids and conjunctivae normal ENMT: Mucous membranes are moist. Posterior pharynx clear of any exudate or lesions.Normal dentition.  Neck: normal, supple, no masses, no thyromegaly Respiratory: clear to auscultation bilaterally, scattered wheezing, no crackles.  Increasing respiratory effort. No accessory muscle use.  Cardiovascular: Regular rate and rhythm, no murmurs / rubs / gallops. No extremity edema. 2+ pedal pulses. No carotid bruits.  Abdomen: no tenderness, no masses palpated. No hepatosplenomegaly. Bowel sounds positive.  Musculoskeletal: no clubbing / cyanosis. No joint deformity upper and lower extremities. Good ROM, no contractures. Normal muscle tone.  Skin: no rashes, lesions, ulcers. No induration Neurologic: CN 2-12 grossly intact. Sensation intact, DTR normal. Strength 5/5 in all 4.  Psychiatric: Normal judgment and insight. Alert and oriented x 3. Normal mood.    Labs on Admission: I have personally reviewed following labs and imaging studies  CBC: Recent Labs  Lab 02/03/21 0244 02/04/21 0506 02/09/21 1227 02/09/21 1256  WBC 4.2 4.8 9.0  --   NEUTROABS  --   --  5.2  --   HGB 11.1* 11.1* 14.0 15.3  HCT 35.8* 36.6* 44.3 45.0  MCV 77.7* 77.5* 76.2*  --   PLT 103* 113* 200  --    Basic Metabolic Panel: Recent Labs  Lab 02/03/21 0244 02/09/21 1227 02/09/21 1256  NA 136 138 138  K 4.2 3.9 3.9  CL 103 99  --   CO2 27 30  --   GLUCOSE 126* 100*  --   BUN 10 8  --   CREATININE 0.59* 0.65  --   CALCIUM 8.4* 9.5  --    GFR: Estimated Creatinine Clearance: 66.7 mL/min (by C-G formula based on SCr of 0.65 mg/dL). Liver Function Tests: Recent Labs  Lab 02/03/21 0244 02/09/21 1227  AST 39 26  ALT 27 31  ALKPHOS 49 69  BILITOT 0.4 0.5  PROT 6.5 8.3*  ALBUMIN 3.1* 3.5   No results for  input(s): LIPASE, AMYLASE in the last 168 hours. No results for input(s): AMMONIA in the last 168 hours. Coagulation Profile: No results for input(s): INR, PROTIME in the last 168 hours. Cardiac Enzymes: No results for input(s): CKTOTAL, CKMB, CKMBINDEX, TROPONINI in the last 168 hours. BNP (last 3 results) No results for input(s): PROBNP in the last 8760 hours. HbA1C: No results for input(s): HGBA1C in the last 72 hours. CBG: No results for input(s): GLUCAP in the last 168 hours. Lipid Profile: No results for input(s): CHOL, HDL, LDLCALC, TRIG, CHOLHDL, LDLDIRECT in the last 72 hours. Thyroid Function Tests: No results for input(s): TSH, T4TOTAL, FREET4, T3FREE, THYROIDAB in  the last 72 hours. Anemia Panel: No results for input(s): VITAMINB12, FOLATE, FERRITIN, TIBC, IRON, RETICCTPCT in the last 72 hours. Urine analysis:    Component Value Date/Time   BILIRUBINUR negative 02/25/2015 1655   KETONESUR negative 02/25/2015 1655   PROTEINUR negative 02/25/2015 1655   UROBILINOGEN 0.2 02/25/2015 1655   NITRITE Negative 02/25/2015 1655   LEUKOCYTESUR Negative 02/25/2015 1655    Radiological Exams on Admission: DG Chest 2 View  Result Date: 02/09/2021 CLINICAL DATA:  sob, sats 86 RA, recent hopsilization, positive flu EXAM: CHEST - 2 VIEW COMPARISON:  February 02, 2021. FINDINGS: Chronic hyperinflation. Vague opacities in the right mid lung. No visible pleural effusions or pneumothorax. Cardiomediastinal silhouette is within normal limits and similar to prior. Calcific atherosclerosis of the aorta. No acute osseous abnormality. IMPRESSION: 1. Vague opacities in the right mid lung, which could represent atelectasis/scar or early pneumonia. 2. Chronic hyperinflation, compatible with emphysema. Electronically Signed   By: Feliberto Harts M.D.   On: 02/09/2021 10:21    EKG: Independently reviewed. Sinus, no acute ST changes  Assessment/Plan Active Problems:   COPD with acute bronchitis  (HCC)   COPD (chronic obstructive pulmonary disease) (HCC)  (please populate well all problems here in Problem List. (For example, if patient is on BP meds at home and you resume or decide to hold them, it is a problem that needs to be her. Same for CAD, COPD, HLD and so on)  Acute hypoxic (and possibly hypercapnic) respiratory failure secondary to COPD/bronchitis exacerbation. -Peak exam and chest x-ray compatible with baseline COPD which was never formally diagnosed. -Starting COPD medications, duo nebs, as needed albuterol, Breo, ordered home medication nebs and inhalers, recommend pulmonology follow-up for formal PFT after exacerbation episode. -De-escalate antibiotics from azithromycin plus Rocephin to doxycycline starting from tomorrow. -DDx, postviral pneumonia, no white count no fever, cough has been nonproductive all against a real PNA despite the xray finding. Will do CT chest to further characterize. -Ambulatory pulse ox for home O2 evaluation.  BPH -Stable, continue Flomax.  Chronic peripheral neuropathy -Continue Cymbalta.  Chronic iron deficiency anemia -Continue iron supplement, outpatient GI follow-up for elective colonoscopy.  Cigarette smoker -Nicotine patch, cessation consult done.   DVT prophylaxis: Lovenox Code Status: Full code Family Communication: Left wife message Disposition Plan: Expect less than 2 midnight hospital stay. Consults called: None Admission status: Medsurg   Emeline General MD [facility] Pager [phone]  02/09/2021, 2:23 PM

## 2021-02-09 NOTE — ED Triage Notes (Signed)
Pt reported he speaks [place] but when interpreter was provided pt wanted to talk to the interpreter in [place]. Pt reported he understood the interpreter.

## 2021-02-09 NOTE — Plan of Care (Signed)

## 2021-02-09 NOTE — ED Triage Notes (Signed)
Pt c/o urinary incontinence.

## 2021-02-09 NOTE — ED Provider Notes (Signed)
[facility]   CSN: [id number] Arrival date & time: 02/09/21  1035     History Chief Complaint  Patient presents with   Shortness of Breath    Leslee HomeFoday Shells is a 75 y.o. male.  Patient is a 75 year old male with a history of tobacco abuse, recent influenza A infection and hospitalization due to hypoxia and assume COPD who is presenting today due to worsening shortness of breath.  Patient has been discharged for 3 days and attempted to go back to work today when he was unable to catch his breath.  He has been feeling chills at night and has continued to cough but denies any productive cough.  He has no chest pain, abdominal pain, nausea or vomiting.  He was seen at urgent care and sent here after an x-ray that showed possible early pneumonia and hypoxia.  During patient's hospitalization he did have signs of hypoxia which resolved after influenza and COPD treatment.  The history is provided by the patient and medical records.  Shortness of Breath Associated symptoms: no abdominal pain, no chest pain, no fever, no headaches, no neck pain, no rash and no vomiting       Past Medical History:  Diagnosis Date   Allergy    Varicose vein     Patient Active Problem List   Diagnosis Date Noted   Hypoxia 02/03/2021   Influenza A 02/02/2021   Acute respiratory failure with hypoxia (HCC) 02/02/2021   Thrombocytopenia (HCC) 02/02/2021   Iron deficiency anemia 12/26/2020   Onychomycosis 05/30/2013   Itching 05/30/2013   History of itching of eye 05/30/2013   Peripheral neuropathy 05/30/2013    History reviewed. No pertinent surgical history.     No family history on file.  Social History   Tobacco Use   Smoking status: Some Days    Types: Cigarettes   Smokeless tobacco: Never  Vaping Use   Vaping Use: Never used  Substance Use Topics   Alcohol use: Not Currently   Drug use: Never    Home Medications Prior to Admission  medications   Medication Sig Start Date End Date Taking? Authorizing Provider  azelastine (OPTIVAR) 0.05 % ophthalmic solution Place 1 drop into both eyes 2 (two) times daily. Patient not taking: Reported on 02/02/2021 07/13/19   Shade FloodGreene, Jeffrey R, MD  DULoxetine (CYMBALTA) 30 MG capsule TAKE 1 CAPSULE BY MOUTH EVERY DAY Patient taking differently: Take 30 mg by mouth daily. 12/15/20   Hoy RegisterNewlin, Enobong, MD  fluticasone (FLONASE) 50 MCG/ACT nasal spray Place 2 sprays into both nostrils daily. Patient not taking: Reported on 02/02/2021 07/13/19   Shade FloodGreene, Jeffrey R, MD  gabapentin (NEURONTIN) 100 MG capsule Take 1-2 capsules (100-200 mg total) by mouth at bedtime. Patient not taking: Reported on 02/02/2021 07/13/19   Shade FloodGreene, Jeffrey R, MD  guaiFENesin-dextromethorphan [facility](ROBITUSSIN DM) 100-10 MG/5ML syrup Take 5 mLs by mouth every 4 (four) hours as needed for cough. 02/04/21   Lanae BoastKc, Ramesh, MD  Iron, Ferrous Sulfate, 325 (65 Fe) MG TABS Take 325 mg by mouth daily. 12/14/20   Hoy RegisterNewlin, Enobong, MD  predniSONE (DELTASONE) 20 MG tablet Take 1 tablet (20 mg total) by mouth daily with breakfast for 4 days. 02/05/21 02/09/21  Lanae BoastKc, Ramesh, MD  tamsulosin (FLOMAX) 0.4 MG CAPS capsule Take 1 capsule (0.4 mg total) by mouth daily. 11/22/20   Hoy RegisterNewlin, Enobong, MD    Allergies    Patient has no known allergies.  Review of Systems  Review of Systems  Constitutional: Negative.  Negative for fever and unexpected weight change.  HENT: Negative.  Negative for congestion and rhinorrhea.   Eyes: Negative.  Negative for photophobia and redness.  Respiratory:  Positive for shortness of breath.   Cardiovascular:  Negative for chest pain and leg swelling.  Gastrointestinal:  Negative for abdominal pain, diarrhea, nausea and vomiting.  Genitourinary: Negative.  Negative for dysuria, flank pain and hematuria.  Musculoskeletal:  Negative for back pain and neck pain.  Skin:  Negative for rash.  Neurological:  Negative for weakness,  light-headedness and headaches.  All other systems reviewed and are negative.  Physical Exam Updated Vital Signs BP (!) 153/80   Pulse 71   Temp (!) 97.5 F (36.4 C) (Oral)   Resp (!) 21   Ht 5\' 2"  (1.575 m)   Wt 65.8 kg   SpO2 100%   BMI 26.52 kg/m   Physical Exam Vitals and nursing note reviewed.  Constitutional:      General: He is not in acute distress.    Appearance: He is well-developed.  HENT:     Head: Normocephalic and atraumatic.  Eyes:     Conjunctiva/sclera: Conjunctivae normal.     Pupils: Pupils are equal, round, and reactive to light.  Cardiovascular:     Rate and Rhythm: Normal rate and regular rhythm.     Heart sounds: No murmur heard. Pulmonary:     Effort: Pulmonary effort is normal. Tachypnea present. No respiratory distress.     Breath sounds: Wheezing present. No rales.     Comments: Expiratory wheezing present in all lobes.  Rales present in the lower lobes.  Decreased breath sounds noted throughout. Abdominal:     General: There is no distension.     Palpations: Abdomen is soft.     Tenderness: There is no abdominal tenderness. There is no guarding or rebound.  Musculoskeletal:        General: No tenderness. Normal range of motion.     Cervical back: Normal range of motion and neck supple.     Right lower leg: No edema.     Left lower leg: No edema.  Skin:    General: Skin is warm and dry.     Findings: No erythema or rash.  Neurological:     Mental Status: He is alert and oriented to person, place, and time. Mental status is at baseline.  Psychiatric:        Mood and Affect: Mood normal.        Behavior: Behavior normal.    ED Results / Procedures / Treatments   Labs (all labs ordered are listed, but only abnormal results are displayed) Labs Reviewed  CBC WITH DIFFERENTIAL/PLATELET - Abnormal; Notable for the following components:      Result Value   MCV 76.2 (*)    MCH 24.1 (*)    RDW 29.0 (*)    Monocytes Absolute 1.3 (*)    All  other components within normal limits  COMPREHENSIVE METABOLIC PANEL - Abnormal; Notable for the following components:   Glucose, Bld 100 (*)    Total Protein 8.3 (*)    All other components within normal limits  I-STAT VENOUS BLOOD GAS, ED - Abnormal; Notable for the following components:   pO2, Ven 64.0 (*)    Bicarbonate 32.6 (*)    TCO2 34 (*)    Acid-Base Excess 6.0 (*)    Calcium, Ion 1.13 (*)    All other components within  normal limits    EKG EKG Interpretation  Date/Time:  Thursday February 09 2021 12:31:04 EDT Ventricular Rate:  69 PR Interval:  131 QRS Duration: 88 QT Interval:  413 QTC Calculation: 443 R Axis:   95 Text Interpretation: Sinus rhythm Anteroseptal infarct, age indeterminate Minimal ST elevation, inferior leads No significant change since last tracing Confirmed by Gwyneth Sprout ([id number]) on 02/09/2021 1:42:24 PM  Radiology DG Chest 2 View  Result Date: 02/09/2021 CLINICAL DATA:  sob, sats 86 RA, recent hopsilization, positive flu EXAM: CHEST - 2 VIEW COMPARISON:  February 02, 2021. FINDINGS: Chronic hyperinflation. Vague opacities in the right mid lung. No visible pleural effusions or pneumothorax. Cardiomediastinal silhouette is within normal limits and similar to prior. Calcific atherosclerosis of the aorta. No acute osseous abnormality. IMPRESSION: 1. Vague opacities in the right mid lung, which could represent atelectasis/scar or early pneumonia. 2. Chronic hyperinflation, compatible with emphysema. Electronically Signed   By: Feliberto Harts M.D.   On: 02/09/2021 10:21    Procedures Procedures   Medications Ordered in ED Medications  albuterol (PROVENTIL) (2.5 MG/3ML) 0.083% nebulizer solution 5 mg (5 mg Nebulization Given 02/09/21 1245)  ipratropium (ATROVENT) nebulizer solution 0.5 mg (0.5 mg Nebulization Given 02/09/21 1245)  methylPREDNISolone sodium succinate (SOLU-MEDROL) 125 mg/2 mL injection 125 mg (125 mg Intravenous Given 02/09/21 1245)     ED Course  I have reviewed the triage vital signs and the nursing notes.  Pertinent labs & imaging results that were available during my care of the patient were reviewed by me and considered in my medical decision making (see chart for details).    MDM Rules/Calculators/A&P                           Elderly gentleman with recent admission for hypoxia, most likely COPD exacerbation and influenza who is returning today after discharge 3 days ago due to worsening shortness of breath.  Patient tried to go back to work today and had worsening symptoms.  Patient was initially seen in the urgent care and sent here for further evaluation.  On exam here on room air patient was satting 83% on room air.  He has diffuse wheezing, some rhonchi and decreased breath sounds.  Chest x-ray from urgent care showed concern for atelectasis versus early infection.  Patient reports that he still having nights chills and sweats.  No recent travel or reason for tuberculosis.  He does continue to smoke cigarettes on occasion.  Patient placed on 2 L of oxygen with improvement of oxygen saturation to 92%.  He was given Solu-Medrol, albuterol and Atrovent.  Will cover with Rocephin and azithromycin and admit for further care.  MDM   Amount and/or Complexity of Data Reviewed Clinical lab tests: ordered and reviewed Tests in the radiology section of CPT: ordered and reviewed Tests in the medicine section of CPT: ordered and reviewed Independent visualization of images, tracings, or specimens: yes    Final Clinical Impression(s) / ED Diagnoses Final diagnoses:  Hypoxic  Community acquired pneumonia, unspecified laterality  COPD exacerbation (HCC)    Rx / DC Orders ED Discharge Orders     None        Gwyneth Sprout, MD 02/09/21 1351

## 2021-02-09 NOTE — ED Triage Notes (Signed)
States he has been feeling tired x 1 month.

## 2021-02-09 NOTE — ED Notes (Signed)
Pt transported to CT ?

## 2021-02-09 NOTE — ED Provider Notes (Signed)
[facility]    CSN: [id number] Arrival date & time: 02/09/21  0840      History   Chief Complaint Chief Complaint  Patient presents with   Shortness of Breath   Weakness   Headache    HPI Bob Scott is a 75 y.o. male.   Pt was in hospital 1 week ago for positive flu ,low ox sats. Discharged 3 days. Went back to work today and was increased sob fatigue sats 86 RA. Denies any fever, no n/v/d. Ill appearance    Past Medical History:  Diagnosis Date   Allergy    Varicose vein     Patient Active Problem List   Diagnosis Date Noted   Hypoxia 02/03/2021   Influenza A 02/02/2021   Acute respiratory failure with hypoxia ([place]) 02/02/2021   Thrombocytopenia ([place]) 02/02/2021   Iron deficiency anemia 12/26/2020   Onychomycosis 05/30/2013   Itching 05/30/2013   History of itching of eye 05/30/2013   Peripheral neuropathy 05/30/2013    History reviewed. No pertinent surgical history.     Home Medications    Prior to Admission medications   Medication Sig Start Date End Date Taking? Authorizing Provider  azelastine (OPTIVAR) 0.05 % ophthalmic solution Place 1 drop into both eyes 2 (two) times daily. Patient not taking: Reported on 02/02/2021 07/13/19   Wendie Agreste, MD  DULoxetine (CYMBALTA) 30 MG capsule TAKE 1 CAPSULE BY MOUTH EVERY DAY Patient taking differently: Take 30 mg by mouth daily. 12/15/20   Charlott Rakes, MD  fluticasone (FLONASE) 50 MCG/ACT nasal spray Place 2 sprays into both nostrils daily. Patient not taking: Reported on 02/02/2021 07/13/19   Wendie Agreste, MD  gabapentin (NEURONTIN) 100 MG capsule Take 1-2 capsules (100-200 mg total) by mouth at bedtime. Patient not taking: Reported on 02/02/2021 07/13/19   Wendie Agreste, MD  guaiFENesin-dextromethorphan [facility] DM) 100-10 MG/5ML syrup Take 5 mLs by mouth every 4 (four) hours as needed for cough. 02/04/21   Antonieta Pert, MD  Iron, Ferrous Sulfate, 325 (65 Fe) MG TABS Take 325 mg by  mouth daily. 12/14/20   Charlott Rakes, MD  predniSONE (DELTASONE) 20 MG tablet Take 1 tablet (20 mg total) by mouth daily with breakfast for 4 days. 02/05/21 02/09/21  Antonieta Pert, MD  tamsulosin (FLOMAX) 0.4 MG CAPS capsule Take 1 capsule (0.4 mg total) by mouth daily. 11/22/20   Charlott Rakes, MD    Family History History reviewed. No pertinent family history.  Social History Social History   Tobacco Use   Smoking status: Some Days    Types: Cigarettes   Smokeless tobacco: Never  Vaping Use   Vaping Use: Never used  Substance Use Topics   Alcohol use: Not Currently   Drug use: Never     Allergies   Patient has no known allergies.   Review of Systems Review of Systems  Constitutional:  Positive for activity change, chills and fatigue. Negative for fever.  HENT:  Negative for congestion, postnasal drip, rhinorrhea, sinus pressure and sinus pain.   Respiratory:  Positive for cough, shortness of breath and wheezing.   Cardiovascular: Negative.   Gastrointestinal: Negative.   Genitourinary: Negative.   Skin: Negative.   Neurological:  Positive for weakness.    Physical Exam Triage Vital Signs ED Triage Vitals  Enc Vitals Group     BP 02/09/21 0936 136/88     Pulse Rate 02/09/21 0936 83     Resp 02/09/21 0936 15  Temp 02/09/21 0942 98.1 F (36.7 C)     Temp Source 02/09/21 0942 Oral     SpO2 02/09/21 0936 (!) 85 %     Weight --      Height --      Head Circumference --      Peak Flow --      Pain Score 02/09/21 0933 0     Pain Loc --      Pain Edu? --      Excl. in GC? --    No data found.  Updated Vital Signs BP 136/88 (BP Location: Right Arm)   Pulse 83   Temp 98.1 F (36.7 C) (Oral)   Resp 15   SpO2 93%   Visual Acuity Right Eye Distance:   Left Eye Distance:   Bilateral Distance:    Right Eye Near:   Left Eye Near:    Bilateral Near:     Physical Exam Constitutional:      Appearance: He is ill-appearing.  Cardiovascular:     Rate and  Rhythm: Normal rate.  Pulmonary:     Effort: Respiratory distress present.     Breath sounds: Examination of the right-lower field reveals wheezing. Examination of the left-lower field reveals wheezing. Wheezing present.     Comments: Sats 86 ra, placed on 2 l ox [place]  Abdominal:     Palpations: Abdomen is soft.  Skin:    General: Skin is warm.  Neurological:     General: No focal deficit present.     Mental Status: He is alert.     UC Treatments / Results  Labs (all labs ordered are listed, but only abnormal results are displayed) Labs Reviewed - No data to display  EKG   Radiology DG Chest 2 View  Result Date: 02/09/2021 CLINICAL DATA:  sob, sats 86 RA, recent hopsilization, positive flu EXAM: CHEST - 2 VIEW COMPARISON:  February 02, 2021. FINDINGS: Chronic hyperinflation. Vague opacities in the right mid lung. No visible pleural effusions or pneumothorax. Cardiomediastinal silhouette is within normal limits and similar to prior. Calcific atherosclerosis of the aorta. No acute osseous abnormality. IMPRESSION: 1. Vague opacities in the right mid lung, which could represent atelectasis/scar or early pneumonia. 2. Chronic hyperinflation, compatible with emphysema. Electronically Signed   By: Feliberto Harts M.D.   On: 02/09/2021 10:21    Procedures Procedures (including critical care time)  Medications Ordered in UC Medications - No data to display  Initial Impression / Assessment and Plan / UC Course  I have reviewed the triage vital signs and the nursing notes.  Pertinent labs & imaging results that were available during my care of the patient were reviewed by me and considered in my medical decision making (see chart for details).     Discussed pt with lamptey  Pt needs to go to er for admit and treatment of pneumonia  Reviewed previous chart and results Nurse will take pt to er sats 90 prior to going to er  Final Clinical Impressions(s) / UC Diagnoses   Final  diagnoses:  None   Discharge Instructions   None    ED Prescriptions   None    PDMP not reviewed this encounter.   Coralyn Mark, NP 02/09/21 1340

## 2021-02-09 NOTE — Progress Notes (Signed)
Pt arrived to room 6N14 via stretcher from the ED. Received report from [place], [place]. See assessment. Will continue to monitor.

## 2021-02-10 DIAGNOSIS — J1000 Influenza due to other identified influenza virus with unspecified type of pneumonia: Secondary | ICD-10-CM | POA: Diagnosis present

## 2021-02-10 DIAGNOSIS — J438 Other emphysema: Secondary | ICD-10-CM | POA: Diagnosis present

## 2021-02-10 DIAGNOSIS — G629 Polyneuropathy, unspecified: Secondary | ICD-10-CM | POA: Diagnosis present

## 2021-02-10 DIAGNOSIS — D509 Iron deficiency anemia, unspecified: Secondary | ICD-10-CM | POA: Diagnosis present

## 2021-02-10 DIAGNOSIS — F17210 Nicotine dependence, cigarettes, uncomplicated: Secondary | ICD-10-CM | POA: Diagnosis present

## 2021-02-10 DIAGNOSIS — Z79899 Other long term (current) drug therapy: Secondary | ICD-10-CM | POA: Diagnosis not present

## 2021-02-10 DIAGNOSIS — N400 Enlarged prostate without lower urinary tract symptoms: Secondary | ICD-10-CM | POA: Diagnosis present

## 2021-02-10 DIAGNOSIS — E861 Hypovolemia: Secondary | ICD-10-CM | POA: Diagnosis present

## 2021-02-10 DIAGNOSIS — J189 Pneumonia, unspecified organism: Secondary | ICD-10-CM | POA: Diagnosis present

## 2021-02-10 DIAGNOSIS — J449 Chronic obstructive pulmonary disease, unspecified: Secondary | ICD-10-CM | POA: Diagnosis present

## 2021-02-10 DIAGNOSIS — J441 Chronic obstructive pulmonary disease with (acute) exacerbation: Secondary | ICD-10-CM | POA: Diagnosis not present

## 2021-02-10 DIAGNOSIS — Z20822 Contact with and (suspected) exposure to covid-19: Secondary | ICD-10-CM | POA: Diagnosis present

## 2021-02-10 DIAGNOSIS — E860 Dehydration: Secondary | ICD-10-CM | POA: Diagnosis present

## 2021-02-10 DIAGNOSIS — J209 Acute bronchitis, unspecified: Secondary | ICD-10-CM | POA: Diagnosis present

## 2021-02-10 DIAGNOSIS — J9601 Acute respiratory failure with hypoxia: Secondary | ICD-10-CM | POA: Diagnosis present

## 2021-02-10 LAB — PROCALCITONIN: Procalcitonin: <0.1 ng/mL

## 2021-02-10 LAB — HIV ANTIBODY (ROUTINE TESTING W REFLEX): HIV Screen 4th Generation wRfx: NONREACTIVE

## 2021-02-10 MED ORDER — LACTATED RINGERS IV SOLN: INTRAVENOUS | Status: DC

## 2021-02-10 MED ORDER — SODIUM CHLORIDE 0.9 % IV SOLN
2.0000 g | Freq: Once | INTRAVENOUS | Status: AC
  Administered 2021-02-10: 2 g via INTRAVENOUS
  Filled 2021-02-10: qty 20

## 2021-02-10 MED ORDER — ENSURE ENLIVE PO LIQD
237.0000 mL | Freq: Three times a day (TID) | ORAL | Status: DC
Start: 2021-02-10 — End: 2021-02-13
  Administered 2021-02-10 – 2021-02-13 (×8): 237 mL via ORAL

## 2021-02-10 MED ORDER — IPRATROPIUM-ALBUTEROL 0.5-2.5 (3) MG/3ML IN SOLN
3.0000 mL | Freq: Three times a day (TID) | RESPIRATORY_TRACT | Status: DC
  Administered 2021-02-10 – 2021-02-11 (×4): 3 mL via RESPIRATORY_TRACT
  Filled 2021-02-10 (×5): qty 3

## 2021-02-10 MED ORDER — ADULT MULTIVITAMIN W/MINERALS CH
1.0000 | ORAL_TABLET | Freq: Every day | ORAL | Status: DC
  Administered 2021-02-11 – 2021-02-13 (×3): 1 via ORAL
  Filled 2021-02-10 (×3): qty 1

## 2021-02-10 MED ORDER — LACTATED RINGERS IV SOLN: INTRAVENOUS | Status: AC

## 2021-02-10 MED ORDER — METHYLPREDNISOLONE SODIUM SUCC 40 MG IJ SOLR
40.0000 mg | Freq: Two times a day (BID) | INTRAMUSCULAR | Status: AC
  Administered 2021-02-10 – 2021-02-13 (×6): 40 mg via INTRAVENOUS
  Filled 2021-02-10 (×6): qty 1

## 2021-02-10 NOTE — Evaluation (Signed)
Physical Therapy Evaluation and D/C Patient Details Name: Bob Scott MRN: [id number] DOB: 1945/05/06 Today's Date: 02/10/2021  History of Present Illness  Pt. is 75 yr old M admitted on 02/09/21 with c/o SOB, HA, and fatigue. Recent hospital admission 1 week ago for the flu. PMH: neuropathy, anemia  Clinical Impression  Pt. Was previously independent with ADLs and functional mobility.  Currently, pt remains independeny but is limited in gait distance by SOB.  No further skilled PT required in acute care, however, pt would benefit from OT education on energy conservation and amb with mobility team to improve endurance.  Pt. Is left sitting up in chair with NT in room.  Safe to return home at D/C.       Recommendations for follow up therapy are one component of a multi-disciplinary discharge planning process, led by the attending physician.  Recommendations may be updated based on patient status, additional functional criteria and insurance authorization.  Follow Up Recommendations No PT follow up    Assistance Recommended at Discharge None  Functional Status Assessment Patient has had a recent decline in their functional status and demonstrates the ability to make significant improvements in function in a reasonable and predictable amount of time.  Equipment Recommendations       Recommendations for Other Services       Precautions / Restrictions Precautions Precautions: None      Mobility  Bed Mobility Overal bed mobility: Independent               Patient Response: Cooperative  Transfers Overall transfer level: Independent                      Ambulation/Gait Ambulation/Gait assistance: Independent Gait Distance (Feet): 20 Feet         General Gait Details: Pt. is completely independent with amb.  O2 sats at rest are 90% or RA, drops to 82% with transfer and 20 ft of amb in room.  Educated on PLB.  No LOB or difficulty with amb outside of SOB.  Stairs             Wheelchair Mobility    Modified Rankin (Stroke Patients Only)       Balance Overall balance assessment: Independent                                           Pertinent Vitals/Pain Pain Assessment: No/denies pain    Home Living Family/patient expects to be discharged to:: Private residence Living Arrangements: Spouse/significant other;Children Available Help at Discharge: Available PRN/intermittently;Family Type of Home: House Home Access: Stairs to enter   Secretary/administrator of Steps: 3   Home Layout: One level Home Equipment: None      Prior Function Prior Level of Function : Independent/Modified Independent                     Hand Dominance        Extremity/Trunk Assessment   Upper Extremity Assessment Upper Extremity Assessment: Defer to OT evaluation    Lower Extremity Assessment Lower Extremity Assessment: Overall WFL for tasks assessed       Communication      Cognition Arousal/Alertness: Awake/alert Behavior During Therapy: WFL for tasks assessed/performed Overall Cognitive Status: Within Functional Limits for tasks assessed  General Comments: Supine in bed when PT arrives.  States that he is feeling better but that his voice is not back to normal.  NT present, getting ready to help patient with a bath.        General Comments General comments (skin integrity, edema, etc.): Pt. encouraged to get up to chair daily and amb in room when hospital staff is present to improve endurance.    Exercises     Assessment/Plan    PT Assessment Patient does not need any further PT services (Pt. primarily needs energy conservation with OT and inc opportunities to amb with mobility team.  LE strength and balance are WNL.)  PT Problem List         PT Treatment Interventions      PT Goals (Current goals can be found in the Care Plan section)  Acute Rehab PT  Goals Patient Stated Goal: Pt's goal is to improve breathing. PT Goal Formulation: With patient Time For Goal Achievement: 02/24/21 Potential to Achieve Goals: Good    Frequency     Barriers to discharge        Co-evaluation               AM-PAC PT "6 Clicks" Mobility  Outcome Measure Help needed turning from your back to your side while in a flat bed without using bedrails?: None Help needed moving from lying on your back to sitting on the side of a flat bed without using bedrails?: None Help needed moving to and from a bed to a chair (including a wheelchair)?: None Help needed standing up from a chair using your arms (e.g., wheelchair or bedside chair)?: None Help needed to walk in hospital room?: None Help needed climbing 3-5 steps with a railing? : A Little 6 Click Score: 23    End of Session   Activity Tolerance: Patient tolerated treatment well Patient left: in chair   PT Visit Diagnosis: Other abnormalities of gait and mobility (R26.89)    Time: 7209-4709 PT Time Calculation (min) (ACUTE ONLY): 9 min   Charges:   PT Evaluation $PT Eval Low Complexity: 1 Low          Bob Scott A. Bob Scott, PT, DPT Acute Rehabilitation Services Office: [phone]   Bob Scott A Bob Scott 02/10/2021, 9:25 AM

## 2021-02-10 NOTE — Progress Notes (Signed)
PROGRESS NOTE  Bob Scott LNL:[id number] DOB: [birth year] DOA: 02/09/2021 PCP: Pcp, No  HPI/Recap of past 24 hours: Bob Scott is a 75 y.o. male with medical history significant of BPH, questionable COPD, recent influenza A infection, chronic iron deficiency anemia, chronic peripheral neuropathy, tobacco abuse, presented with worsening of dry cough, shortness of breath, and hypoxia with O2 saturation 82% on room air.  Patient was recently hospitalized for acute hypoxic respiratory failure secondary to influenza A infection, was discharged home 4 days ago after hypoxia resolved.  But increasingly more, patient again developed shortness of breath, worsening of dry cough and wheezing.  Stabilized on 4 L.  Chest x-ray showed questionable right middle lobe pneumonia, ED started patient on ceftriaxone and azithromycin.    02/10/2021: Patient was seen and examined at his bedside.  Reports persistent dyspnea with minimal exertion.  Associated with a semi-productive cough.  He failed his home oxygen evaluation with O2 saturation in the 80s on room air with ambulation.  Started on IV Solu-Medrol and Rocephin.  Assessment/Plan: Active Problems:   Hypoxic   COPD with acute bronchitis (HCC)   COPD exacerbation (HCC)  Acute hypoxic respiratory failure, suspect multifactorial secondary to COPD/bronchitis exacerbation with concern for possible superimposed bacterial pulmonary infection. Not on oxygen supplementation at baseline Currently requiring 3 to 4 L to maintain O2 saturation greater than 90%. Recently diagnosed with influenza A on 02/02/2021, was discharged home on 02/04/2021 with recommendation to complete Tamiflu and to continue steroid for a few more days.  At discharge he was not requiring any oxygen supplementation. Returns due to worsening symptoms Start IV Solu-Medrol and Rocephin due to possible superimposed bacterial infection. Obtain MRSA screening test, if positive, cover for  MRSA Personally reviewed CT scan done on 02/09/2021 showing bronchiectasis with scattered patchy densities in the lower lungs, right middle lobe and lingula.  Influenza A viral infection with concern for possible superimposed bacterial pulmonary infection. First tested positive on 02/02/2021, repeated test on 02/09/2021 positive. Per the patient he was compliant with his prescribed Tamiflu. Start Rocephin, day #1. Continue symptomatic management  Generalized weakness PT OT assessed no further recommendations Continue to mobilize as tolerated.  Dehydration due to poor oral intake Hypovolemic on exam Start gentle IV fluid hydration LR at 50 cc/h x 2 days. Encourage oral intake.     Code Status: Full code  Family Communication: None at bedside.  Disposition Plan: Likely will discharge to home once his oxygen requirement improves.  Consultants: None.  Procedures: None.  Antimicrobials: Rocephin, started on 02/10/2021.  DVT prophylaxis: Subcu Lovenox daily  Status is: Inpatient  Inpatient status.  Patient will require at least 2 midnights for further evaluation and treatment of present condition.      Objective: Vitals:   02/10/21 0450 02/10/21 0803 02/10/21 0823 02/10/21 1401  BP: 135/76 (!) 143/77    Pulse: 61 (!) 54    Resp: 19 18    Temp: 97.6 F (36.4 C) 98 F (36.7 C)    TempSrc: Oral Oral    SpO2: 96% 99% 95% 96%  Weight:      Height:        Intake/Output Summary (Last 24 hours) at 02/10/2021 1527 Last data filed at 02/10/2021 4081 Gross per 24 hour  Intake 240 ml  Output 200 ml  Net 40 ml   Filed Weights   02/09/21 1053  Weight: 65.8 kg    Exam:  General: 75 y.o. year-old male well developed well nourished in no acute  distress.  Alert and oriented x3. Cardiovascular: Regular rate and rhythm with no rubs or gallops.  No thyromegaly or JVD noted.   Respiratory: Mild diffuse wheezing bilaterally. Good inspiratory effort. Abdomen: Soft  nontender nondistended with normal bowel sounds x4 quadrants. Musculoskeletal: No lower extremity edema. 2/4 pulses in all 4 extremities. Skin: No ulcerative lesions noted or rashes, Psychiatry: Mood is appropriate for condition and setting   Data Reviewed: CBC: Recent Labs  Lab 02/04/21 0506 02/09/21 1227 02/09/21 1256  WBC 4.8 9.0  --   NEUTROABS  --  5.2  --   HGB 11.1* 14.0 15.3  HCT 36.6* 44.3 45.0  MCV 77.5* 76.2*  --   PLT 113* 200  --    Basic Metabolic Panel: Recent Labs  Lab 02/09/21 1227 02/09/21 1256  NA 138 138  K 3.9 3.9  CL 99  --   CO2 30  --   GLUCOSE 100*  --   BUN 8  --   CREATININE 0.65  --   CALCIUM 9.5  --    GFR: Estimated Creatinine Clearance: 66.7 mL/min (by C-G formula based on SCr of 0.65 mg/dL). Liver Function Tests: Recent Labs  Lab 02/09/21 1227  AST 26  ALT 31  ALKPHOS 69  BILITOT 0.5  PROT 8.3*  ALBUMIN 3.5   No results for input(s): LIPASE, AMYLASE in the last 168 hours. No results for input(s): AMMONIA in the last 168 hours. Coagulation Profile: No results for input(s): INR, PROTIME in the last 168 hours. Cardiac Enzymes: No results for input(s): CKTOTAL, CKMB, CKMBINDEX, TROPONINI in the last 168 hours. BNP (last 3 results) No results for input(s): PROBNP in the last 8760 hours. HbA1C: No results for input(s): HGBA1C in the last 72 hours. CBG: No results for input(s): GLUCAP in the last 168 hours. Lipid Profile: No results for input(s): CHOL, HDL, LDLCALC, TRIG, CHOLHDL, LDLDIRECT in the last 72 hours. Thyroid Function Tests: No results for input(s): TSH, T4TOTAL, FREET4, T3FREE, THYROIDAB in the last 72 hours. Anemia Panel: No results for input(s): VITAMINB12, FOLATE, FERRITIN, TIBC, IRON, RETICCTPCT in the last 72 hours. Urine analysis:    Component Value Date/Time   BILIRUBINUR negative 02/25/2015 1655   KETONESUR negative 02/25/2015 1655   PROTEINUR negative 02/25/2015 1655   UROBILINOGEN 0.2 02/25/2015 1655    NITRITE Negative 02/25/2015 1655   LEUKOCYTESUR Negative 02/25/2015 1655   Sepsis Labs: @LABRCNTIP (procalcitonin:4,lacticidven:4)  ) Recent Results (from the past 240 hour(s))  Resp Panel by RT-PCR (Flu A&B, Covid) Nasopharyngeal Swab     Status: Abnormal   Collection Time: 02/02/21 10:09 AM   Specimen: Nasopharyngeal Swab; Nasopharyngeal(NP) swabs in vial transport medium  Result Value Ref Range Status   SARS Coronavirus 2 by RT PCR NEGATIVE NEGATIVE Final    Comment: (NOTE) SARS-CoV-2 target nucleic acids are NOT DETECTED.  The SARS-CoV-2 RNA is generally detectable in upper respiratory specimens during the acute phase of infection. The lowest concentration of SARS-CoV-2 viral copies this assay can detect is 138 copies/mL. A negative result does not preclude SARS-Cov-2 infection and should not be used as the sole basis for treatment or other patient management decisions. A negative result may occur with  improper specimen collection/handling, submission of specimen other than nasopharyngeal swab, presence of viral mutation(s) within the areas targeted by this assay, and inadequate number of viral copies(<138 copies/mL). A negative result must be combined with clinical observations, patient history, and epidemiological information. The expected result is Negative.  Fact Sheet for Patients:  BloggerCourse.com  Fact Sheet for Healthcare Providers:  SeriousBroker.it  This test is no t yet approved or cleared by the [place] FDA and  has been authorized for detection and/or diagnosis of SARS-CoV-2 by FDA under an Emergency Use Authorization (EUA). This EUA will remain  in effect (meaning this test can be used) for the duration of the COVID-19 declaration under Section 564(b)(1) of the Act, 21 U.S.C.section 360bbb-3(b)(1), unless the authorization is terminated  or revoked sooner.       Influenza A by PCR POSITIVE (A)  NEGATIVE Final   Influenza B by PCR NEGATIVE NEGATIVE Final    Comment: (NOTE) The Xpert Xpress SARS-CoV-2/FLU/RSV plus assay is intended as an aid in the diagnosis of influenza from Nasopharyngeal swab specimens and should not be used as a sole basis for treatment. Nasal washings and aspirates are unacceptable for Xpert Xpress SARS-CoV-2/FLU/RSV testing.  Fact Sheet for Patients: BloggerCourse.com  Fact Sheet for Healthcare Providers: SeriousBroker.it  This test is not yet approved or cleared by the [place] FDA and has been authorized for detection and/or diagnosis of SARS-CoV-2 by FDA under an Emergency Use Authorization (EUA). This EUA will remain in effect (meaning this test can be used) for the duration of the COVID-19 declaration under Section 564(b)(1) of the Act, 21 U.S.C. section 360bbb-3(b)(1), unless the authorization is terminated or revoked.  Performed at [facility], [address] [place], [place] [address]   Resp Panel by RT-PCR (Flu A&B, Covid) Nasopharyngeal Swab     Status: Abnormal   Collection Time: 02/09/21  2:13 PM   Specimen: Nasopharyngeal Swab; Nasopharyngeal(NP) swabs in vial transport medium  Result Value Ref Range Status   SARS Coronavirus 2 by RT PCR NEGATIVE NEGATIVE Final    Comment: (NOTE) SARS-CoV-2 target nucleic acids are NOT DETECTED.  The SARS-CoV-2 RNA is generally detectable in upper respiratory specimens during the acute phase of infection. The lowest concentration of SARS-CoV-2 viral copies this assay can detect is 138 copies/mL. A negative result does not preclude SARS-Cov-2 infection and should not be used as the sole basis for treatment or other patient management decisions. A negative result may occur with  improper specimen collection/handling, submission of specimen other than nasopharyngeal swab, presence of viral mutation(s) within the areas targeted by this  assay, and inadequate number of viral copies(<138 copies/mL). A negative result must be combined with clinical observations, patient history, and epidemiological information. The expected result is Negative.  Fact Sheet for Patients:  BloggerCourse.com  Fact Sheet for Healthcare Providers:  SeriousBroker.it  This test is no t yet approved or cleared by the [place] FDA and  has been authorized for detection and/or diagnosis of SARS-CoV-2 by FDA under an Emergency Use Authorization (EUA). This EUA will remain  in effect (meaning this test can be used) for the duration of the COVID-19 declaration under Section 564(b)(1) of the Act, 21 U.S.C.section 360bbb-3(b)(1), unless the authorization is terminated  or revoked sooner.       Influenza A by PCR POSITIVE (A) NEGATIVE Final   Influenza B by PCR NEGATIVE NEGATIVE Final    Comment: (NOTE) The Xpert Xpress SARS-CoV-2/FLU/RSV plus assay is intended as an aid in the diagnosis of influenza from Nasopharyngeal swab specimens and should not be used as a sole basis for treatment. Nasal washings and aspirates are unacceptable for Xpert Xpress SARS-CoV-2/FLU/RSV testing.  Fact Sheet for Patients: BloggerCourse.com  Fact Sheet for Healthcare Providers: SeriousBroker.it  This test is not yet approved or  cleared by the [place] and has been authorized for detection and/or diagnosis of SARS-CoV-2 by FDA under an Emergency Use Authorization (EUA). This EUA will remain in effect (meaning this test can be used) for the duration of the COVID-19 declaration under Section 564(b)(1) of the Act, 21 U.S.C. section 360bbb-3(b)(1), unless the authorization is terminated or revoked.  Performed at [facility], [address] [place], [place] [address]       Studies: No results found.  Scheduled Meds:  doxycycline  100 mg  Oral Q12H   DULoxetine  30 mg Oral Daily   enoxaparin (LOVENOX) injection  40 mg Subcutaneous Q24H   ferrous sulfate  325 mg Oral Daily   fluticasone furoate-vilanterol  1 puff Inhalation Daily   ipratropium-albuterol  3 mL Nebulization TID   mouth rinse  15 mL Mouth Rinse BID   methylPREDNISolone (SOLU-MEDROL) injection  40 mg Intravenous Q12H   nicotine  7 mg Transdermal Daily   tamsulosin  0.4 mg Oral Daily    Continuous Infusions:  lactated ringers 50 mL/hr at 02/10/21 1311     LOS: 0 days     Darlin Drop, MD [facility] Pager [phone]  If 7PM-7AM, please contact night-coverage [email or web address] Password [facility] 02/10/2021, 3:27 PM

## 2021-02-10 NOTE — Progress Notes (Signed)
Physical Therapy Discharge Patient Details Name: Bob Scott MRN: [id number] DOB: Mar 18, 1946 Today's Date: 02/10/2021 Time: 6484-7207 PT Time Calculation (min) (ACUTE ONLY): 9 min  Patient discharged from PT services secondary to goals met and no further PT needs identified.  Please see latest therapy progress note for current level of functioning and progress toward goals.    Progress and discharge plan discussed with patient and/or caregiver: Patient/Caregiver agrees with plan  GP    Carole Deere A. Charnae Lill, PT, DPT Acute Rehabilitation Services Office: [place] 02/10/2021, 9:28 AM

## 2021-02-10 NOTE — Progress Notes (Signed)
Mobility Specialist Progress Note:   02/10/21 1600  Mobility  Activity Ambulated in room  Level of Assistance Independent  Assistive Device None  Distance Ambulated (ft) 440 ft  Mobility Ambulated independently in room  Mobility Response Tolerated well  Mobility performed by Mobility specialist  Bed Position Chair  $Mobility charge 1 Mobility   Pre Mobility: 94% During Mobility: 84-89% Post Mobility: 90%  Session was completed on 4LO2. Pt stated he had minor SOB during ambulation. SpO2 dropped to 84% during amb, recovered with pursed lip breathing.   Addison Lank Mobility Specialist  Phone [phone]

## 2021-02-10 NOTE — Progress Notes (Addendum)
Initial Nutrition Assessment  DOCUMENTATION CODES:   Severe malnutrition in context of chronic illness  INTERVENTION:   Multivitamin w/ minerals daily  Ensure Enlive po TID, each supplement provides 350 kcal and 20 grams of protein  Recommend liberalizing pt to a regular diet due to poor PO and malnutrition. Received ok from MD. Encourage good PO intake  NUTRITION DIAGNOSIS:   Severe Malnutrition related to chronic illness (COPD) as evidenced by severe muscle depletion, severe fat depletion.  GOAL:   Patient will meet greater than or equal to 90% of their needs  MONITOR:   PO intake, Supplement acceptance, Weight trends, Labs, I & O's  REASON FOR ASSESSMENT:   Consult Assessment of nutrition requirement/status  ASSESSMENT:   75 y.o. male presented to the ED due to SOB while at work. Pt recently hospitalized with acute hypoxic respiratory failure 2/2 to Flu+ and was discharged on 10/29. PMH includes COPD, tobacco abuse, and iron deficiency anemia. Pt admitted with acute hypoxic respiratory failure 2/2 COPD/bronchitis exacerbation.   Pt reports that he is eating ok and that his appetite is good. Pt states that his wife does the cooking. Reports a typical intake of; Breakfast: eggs, cream of wheat, and granola; Lunch: rice; Dinner: Some African foods.  Per EMR, pt only ate 50% of his breakfast on 11/4.   Pt states that he cannot talk loud because of his voice. Pt declines difficulty eating due to SOB or fatigue. Declines difficulty chewing or swallowing.   Pt is unable to recall his UBW, states that he does not weight himself. Pt does not endorse any weight loss and has not noticed any of his clothes fitting him looser. Per EMR, pt has gained weight since last admission. Although, suspect that current weight is inaccurate and is stated.  Discussed ONS with pt; pt agreeable to Ensure.   Medications reviewed and include: Doxycycline, Ferrous Sulfate, Prednisone Labs  reviewed.  NUTRITION - FOCUSED PHYSICAL EXAM:  Flowsheet Row Most Recent Value  Orbital Region Severe depletion  Upper Arm Region Severe depletion  Thoracic and Lumbar Region Severe depletion  Buccal Region Severe depletion  Temple Region Moderate depletion  Clavicle Bone Region Moderate depletion  Clavicle and Acromion Bone Region Moderate depletion  Scapular Bone Region Moderate depletion  Dorsal Hand Moderate depletion  Patellar Region Severe depletion  Anterior Thigh Region Severe depletion  Posterior Calf Region Severe depletion  Edema (RD Assessment) None  Hair Reviewed  Eyes Reviewed  Mouth Reviewed  Skin Reviewed  Nails Reviewed       Diet Order:   Diet Order             Diet regular Room service appropriate? Yes; Fluid consistency: Thin  Diet effective now                   EDUCATION NEEDS:   No education needs have been identified at this time  Skin:  Skin Assessment: Reviewed RN Assessment  Last BM:  02/09/2021  Height:   Ht Readings from Last 1 Encounters:  02/09/21 5\' 2"  (1.575 m)    Weight:   Wt Readings from Last 1 Encounters:  02/09/21 65.8 kg    Ideal Body Weight:  53.6 kg  BMI:  Body mass index is 26.52 kg/m.  Estimated Nutritional Needs:   Kcal:  2000-2200  Protein:  100-115 grams  Fluid:  > 2 L    Soul Hackman BS, PLDN Clinical Dietitian See AMiON for contact information.

## 2021-02-10 NOTE — Evaluation (Signed)
Occupational Therapy Evaluation Patient Details Name: Bob Scott MRN: [id number] DOB: [birth year] Today's Date: 02/10/2021   History of Present Illness Pt is 75 y/o M admitted for SOB and fatigue, admitted 1 week ago for the flu, PMH includes neuropathy and anemia.   Clinical Impression   Prior to admission, pt independent with ADLs and functional mobility, works as a Advertising copywriter for [employer]. Pt lives with spouse/family in single story home with 2 steps to enter and tub/shower setup. Pt currently on 3L O2 via [place], O2 fluctuated between 87-92% throughout session, pt reports feeling fatigued. Pt Min guard for toileting and transfer, Min guard for LE dressing. Educated pt on energy conservation methods while dressing, and discussed use of a shower chair when bathing. Provided pt with handout and educated pt on energy conservation strategies for ADLs/IADLs. Pt currently limited by SOB and requires increased time, however has no acute OT needs, will s/o. Recommend safe d/c home with family.     Recommendations for follow up therapy are one component of a multi-disciplinary discharge planning process, led by the attending physician.  Recommendations may be updated based on patient status, additional functional criteria and insurance authorization.   Follow Up Recommendations  No OT follow up    Assistance Recommended at Discharge Intermittent Supervision/Assistance  Functional Status Assessment  Patient has had a recent decline in their functional status and demonstrates the ability to make significant improvements in function in a reasonable and predictable amount of time.  Equipment Recommendations  BSC    Recommendations for Other Services PT consult     Precautions / Restrictions Precautions Precautions: None Restrictions Weight Bearing Restrictions: No      Mobility Bed Mobility Overal bed mobility: Modified Independent                  Transfers Overall transfer level:  Modified independent Equipment used: None Transfers: Sit to/from Stand Sit to Stand: Min guard           General transfer comment: needs increased time      Balance Overall balance assessment: Modified Independent                                         ADL either performed or assessed with clinical judgement   ADL Overall ADL's : Needs assistance/impaired Eating/Feeding: Set up;Sitting   Grooming: Set up;Sitting   Upper Body Bathing: Set up;Sitting   Lower Body Bathing: Moderate assistance;Minimal assistance;Sitting/lateral leans   Upper Body Dressing : Minimal assistance;Sitting   Lower Body Dressing: Sit to/from stand;Min guard Lower Body Dressing Details (indicate cue type and reason): able to reach down and pull up socks using figure 4 method Toilet Transfer: Min guard;Regular Toilet;Cueing for Chief of Staff Details (indicate cue type and reason): pt remained on 3L O2 while toileting, SpO2 ranging from 87-92 throughout session. Toileting- Clothing Manipulation and Hygiene: Supervision/safety Toileting - Clothing Manipulation Details (indicate cue type and reason): able to perform pericare in standing     Functional mobility during ADLs: Min guard General ADL Comments: provided pt with handout on ADL conservation strategies     Vision Baseline Vision/History: 0 No visual deficits Vision Assessment?: No apparent visual deficits     Perception     Praxis      Pertinent Vitals/Pain Pain Assessment: No/denies pain     Hand Dominance     Extremity/Trunk Assessment Upper Extremity Assessment  Upper Extremity Assessment: Overall WFL for tasks assessed   Lower Extremity Assessment Lower Extremity Assessment: Defer to PT evaluation   Cervical / Trunk Assessment Cervical / Trunk Assessment: Normal   Communication Communication Communication: No difficulties   Cognition Arousal/Alertness: Awake/alert Behavior During Therapy: WFL  for tasks assessed/performed Overall Cognitive Status: Within Functional Limits for tasks assessed                                 General Comments: Pt received in chair, 3L O2     General Comments  Pt states he feels he is able to breathe better sitting in chair    Exercises     Shoulder Instructions      Home Living Family/patient expects to be discharged to:: Private residence Living Arrangements: Spouse/significant other;Children Available Help at Discharge: Available PRN/intermittently;Family Type of Home: House Home Access: Stairs to enter Secretary/administrator of Steps: 3   Home Layout: One level     Bathroom Shower/Tub: Theme park manager: No   Home Equipment: None          Prior Functioning/Environment Prior Level of Function : Independent/Modified Independent             Mobility Comments: takes short steps due to fatigue ADLs Comments: cleans, works as Advertising copywriter at [employer]        OT Problem List: Decreased strength;Decreased activity tolerance;Pain;Cardiopulmonary status limiting activity      OT Treatment/Interventions:      OT Goals(Current goals can be found in the care plan section) Acute Rehab OT Goals Patient Stated Goal: return home/get back to normal OT Goal Formulation: With patient Time For Goal Achievement: 02/24/21 Potential to Achieve Goals: Good  OT Frequency:     Barriers to D/C:            Co-evaluation              AM-PAC OT "6 Clicks" Daily Activity     Outcome Measure Help from another person eating meals?: A Little Help from another person taking care of personal grooming?: A Little Help from another person toileting, which includes using toliet, bedpan, or urinal?: A Little Help from another person bathing (including washing, rinsing, drying)?: A Little Help from another person to put on and taking off regular upper body clothing?: A Little Help  from another person to put on and taking off regular lower body clothing?: A Little 6 Click Score: 3   End of Session Equipment Utilized During Treatment: Gait belt Nurse Communication: Mobility status;Other (comment) (SpO2 levels of 87-92% throughout session, left on 3L O2)  Activity Tolerance: Patient limited by fatigue Patient left: in chair;with call bell/phone within reach;with nursing/sitter in room  OT Visit Diagnosis: Unsteadiness on feet (R26.81);Other abnormalities of gait and mobility (R26.89);Pain;Muscle weakness (generalized) (M62.81)                Time: 4854-6270 OT Time Calculation (min): 27 min Charges:  OT General Charges $OT Visit: 1 Visit OT Evaluation $OT Eval Low Complexity: 1 Low OT Treatments $Self Care/Home Management : 8-22 mins  Alfonzo Beers, OTD, OTR/L Acute Rehab [phone]) [phone]  Mayer Masker 02/10/2021, 1:43 PM

## 2021-02-11 LAB — MRSA NEXT GEN BY PCR, NASAL: MRSA by PCR Next Gen: NOT DETECTED

## 2021-02-11 MED ORDER — IPRATROPIUM-ALBUTEROL 0.5-2.5 (3) MG/3ML IN SOLN
3.0000 mL | Freq: Two times a day (BID) | RESPIRATORY_TRACT | Status: DC
  Administered 2021-02-11 – 2021-02-12 (×3): 3 mL via RESPIRATORY_TRACT
  Filled 2021-02-11 (×3): qty 3

## 2021-02-11 NOTE — Progress Notes (Signed)
PROGRESS NOTE  Bob Scott WGY:[id number] DOB: 12/13/1945 DOA: 02/09/2021 PCP: Pcp, No  HPI/Recap of past 24 hours: Bob Scott is a 75 y.o. male with medical history significant of BPH, tobacco use disorder, recent influenza A infection, chronic iron deficiency anemia, chronic peripheral neuropathy, tobacco abuse, presented with worsening of dry cough, shortness of breath, and hypoxia with O2 saturation 82% on room air.  Patient was recently hospitalized for acute hypoxic respiratory failure secondary to influenza A infection, was discharged home 4 days prior to presentation after hypoxia resolved.  But increasingly more, patient again developed shortness of breath, worsening of dry cough and wheezing.  Stabilized on 4 L.  Chest x-ray showed questionable right middle lobe pneumonia, ED started patient on ceftriaxone and azithromycin due to concern for COPD exacerbation and superimposed bacterial pulmonary infection.  IV Solu-Medrol added on 02/10/2021.  02/11/2021: Patient was seen and examined at bedside.  He had no new complaints.  Mild rales diffusely and mild wheezing noted on exam.  Assessment/Plan: Active Problems:   Hypoxic   COPD with acute bronchitis (HCC)   COPD exacerbation (HCC)  Acute hypoxic respiratory failure, suspect multifactorial secondary to COPD exacerbation with concern for superimposed bacterial pulmonary infection. Not on oxygen supplementation at baseline Currently requiring 3 L to maintain O2 saturation greater than 90%. Recently diagnosed with influenza A on 02/02/2021, was discharged home on 02/04/2021 with recommendation to complete Tamiflu and to continue steroid for a few more days.  At discharge he was not requiring any oxygen supplementation. Returns due to worsening symptoms Continue IV Solu-Medrol and Rocephin due to possible superimposed bacterial infection. Obtain MRSA screening test, if positive, cover for MRSA Personally reviewed CT scan done on 02/09/2021  showing bronchiectasis with scattered patchy densities in the lower lungs, right middle lobe and lingula.  Influenza A viral infection with concern for possible superimposed bacterial pulmonary infection. First tested positive on 02/02/2021, repeated test on 02/09/2021 positive. Per the patient he was compliant with his prescribed Tamiflu. Rocephin2 day #1. Continue symptomatic management  Generalized weakness PT OT assessed no further recommendations Continue to mobilize as tolerated.  Dehydration due to poor oral intake Hypovolemic on exam Continue gentle IV fluid hydration LR at 50 cc/h x 2 days. Continue to encourage oral intake.     Code Status: Full code  Family Communication: None at bedside.  Disposition Plan: Likely will discharge to home once his oxygen requirement improves.  Consultants: None.  Procedures: None.  Antimicrobials: Rocephin, started on 02/10/2021.  DVT prophylaxis: Subcu Lovenox daily  Status is: Inpatient  Inpatient status.  Patient will require at least 2 midnights for further evaluation and treatment of present condition.      Objective: Vitals:   02/10/21 2156 02/11/21 0557 02/11/21 0814 02/11/21 0836  BP: 136/74 (!) 141/75 140/80   Pulse: 77 60 (!) 57   Resp: 18 18 17    Temp: 97.8 F (36.6 C) 97.6 F (36.4 C) 97.8 F (36.6 C)   TempSrc: Oral Oral Oral   SpO2: 94% 97% 99% 93%  Weight:      Height:        Intake/Output Summary (Last 24 hours) at 02/11/2021 1500 Last data filed at 02/11/2021 1235 Gross per 24 hour  Intake 597.49 ml  Output 700 ml  Net -102.51 ml   Filed Weights   02/09/21 1053  Weight: 65.8 kg    Exam:  General: 75 y.o. year-old male well-developed well-nourished in no acute distress.  He is alert oriented  x3. Cardiovascular: Regular rate and rhythm no rubs or gallops.  Respiratory: Mild diffuse wheezing bilaterally, mild rales noted bilaterally as well.  Good inspiratory effort.   Abdomen: Soft  nontender normal bowel sounds present. Musculoskeletal: No lower extremity edema bilaterally.   Skin: No ulcerative lesions noted or rashes. Psychiatry: Mood is appropriate for condition and setting.   Data Reviewed: CBC: Recent Labs  Lab 02/09/21 1227 02/09/21 1256  WBC 9.0  --   NEUTROABS 5.2  --   HGB 14.0 15.3  HCT 44.3 45.0  MCV 76.2*  --   PLT 200  --    Basic Metabolic Panel: Recent Labs  Lab 02/09/21 1227 02/09/21 1256  NA 138 138  K 3.9 3.9  CL 99  --   CO2 30  --   GLUCOSE 100*  --   BUN 8  --   CREATININE 0.65  --   CALCIUM 9.5  --    GFR: Estimated Creatinine Clearance: 66.7 mL/min (by C-G formula based on SCr of 0.65 mg/dL). Liver Function Tests: Recent Labs  Lab 02/09/21 1227  AST 26  ALT 31  ALKPHOS 69  BILITOT 0.5  PROT 8.3*  ALBUMIN 3.5   No results for input(s): LIPASE, AMYLASE in the last 168 hours. No results for input(s): AMMONIA in the last 168 hours. Coagulation Profile: No results for input(s): INR, PROTIME in the last 168 hours. Cardiac Enzymes: No results for input(s): CKTOTAL, CKMB, CKMBINDEX, TROPONINI in the last 168 hours. BNP (last 3 results) No results for input(s): PROBNP in the last 8760 hours. HbA1C: No results for input(s): HGBA1C in the last 72 hours. CBG: No results for input(s): GLUCAP in the last 168 hours. Lipid Profile: No results for input(s): CHOL, HDL, LDLCALC, TRIG, CHOLHDL, LDLDIRECT in the last 72 hours. Thyroid Function Tests: No results for input(s): TSH, T4TOTAL, FREET4, T3FREE, THYROIDAB in the last 72 hours. Anemia Panel: No results for input(s): VITAMINB12, FOLATE, FERRITIN, TIBC, IRON, RETICCTPCT in the last 72 hours. Urine analysis:    Component Value Date/Time   BILIRUBINUR negative 02/25/2015 1655   KETONESUR negative 02/25/2015 1655   PROTEINUR negative 02/25/2015 1655   UROBILINOGEN 0.2 02/25/2015 1655   NITRITE Negative 02/25/2015 1655   LEUKOCYTESUR Negative 02/25/2015 1655   Sepsis  Labs: @LABRCNTIP (procalcitonin:4,lacticidven:4)  ) Recent Results (from the past 240 hour(s))  Resp Panel by RT-PCR (Flu A&B, Covid) Nasopharyngeal Swab     Status: Abnormal   Collection Time: 02/02/21 10:09 AM   Specimen: Nasopharyngeal Swab; Nasopharyngeal(NP) swabs in vial transport medium  Result Value Ref Range Status   SARS Coronavirus 2 by RT PCR NEGATIVE NEGATIVE Final    Comment: (NOTE) SARS-CoV-2 target nucleic acids are NOT DETECTED.  The SARS-CoV-2 RNA is generally detectable in upper respiratory specimens during the acute phase of infection. The lowest concentration of SARS-CoV-2 viral copies this assay can detect is 138 copies/mL. A negative result does not preclude SARS-Cov-2 infection and should not be used as the sole basis for treatment or other patient management decisions. A negative result may occur with  improper specimen collection/handling, submission of specimen other than nasopharyngeal swab, presence of viral mutation(s) within the areas targeted by this assay, and inadequate number of viral copies(<138 copies/mL). A negative result must be combined with clinical observations, patient history, and epidemiological information. The expected result is Negative.  Fact Sheet for Patients:  02/04/21  Fact Sheet for Healthcare Providers:  BloggerCourse.com  This test is no t yet approved or cleared by  the [employer] and  has been authorized for detection and/or diagnosis of SARS-CoV-2 by FDA under an Emergency Use Authorization (EUA). This EUA will remain  in effect (meaning this test can be used) for the duration of the COVID-19 declaration under Section 564(b)(1) of the Act, 21 U.S.C.section 360bbb-3(b)(1), unless the authorization is terminated  or revoked sooner.       Influenza A by PCR POSITIVE (A) NEGATIVE Final   Influenza B by PCR NEGATIVE NEGATIVE Final    Comment: (NOTE) The  Xpert Xpress SARS-CoV-2/FLU/RSV plus assay is intended as an aid in the diagnosis of influenza from Nasopharyngeal swab specimens and should not be used as a sole basis for treatment. Nasal washings and aspirates are unacceptable for Xpert Xpress SARS-CoV-2/FLU/RSV testing.  Fact Sheet for Patients: BloggerCourse.com  Fact Sheet for Healthcare Providers: SeriousBroker.it  This test is not yet approved or cleared by the [place] FDA and has been authorized for detection and/or diagnosis of SARS-CoV-2 by FDA under an Emergency Use Authorization (EUA). This EUA will remain in effect (meaning this test can be used) for the duration of the COVID-19 declaration under Section 564(b)(1) of the Act, 21 U.S.C. section 360bbb-3(b)(1), unless the authorization is terminated or revoked.  Performed at [facility], [address] [place], [place] [address]   Resp Panel by RT-PCR (Flu A&B, Covid) Nasopharyngeal Swab     Status: Abnormal   Collection Time: 02/09/21  2:13 PM   Specimen: Nasopharyngeal Swab; Nasopharyngeal(NP) swabs in vial transport medium  Result Value Ref Range Status   SARS Coronavirus 2 by RT PCR NEGATIVE NEGATIVE Final    Comment: (NOTE) SARS-CoV-2 target nucleic acids are NOT DETECTED.  The SARS-CoV-2 RNA is generally detectable in upper respiratory specimens during the acute phase of infection. The lowest concentration of SARS-CoV-2 viral copies this assay can detect is 138 copies/mL. A negative result does not preclude SARS-Cov-2 infection and should not be used as the sole basis for treatment or other patient management decisions. A negative result may occur with  improper specimen collection/handling, submission of specimen other than nasopharyngeal swab, presence of viral mutation(s) within the areas targeted by this assay, and inadequate number of viral copies(<138 copies/mL). A negative result must be  combined with clinical observations, patient history, and epidemiological information. The expected result is Negative.  Fact Sheet for Patients:  BloggerCourse.com  Fact Sheet for Healthcare Providers:  SeriousBroker.it  This test is no t yet approved or cleared by the [place] FDA and  has been authorized for detection and/or diagnosis of SARS-CoV-2 by FDA under an Emergency Use Authorization (EUA). This EUA will remain  in effect (meaning this test can be used) for the duration of the COVID-19 declaration under Section 564(b)(1) of the Act, 21 U.S.C.section 360bbb-3(b)(1), unless the authorization is terminated  or revoked sooner.       Influenza A by PCR POSITIVE (A) NEGATIVE Final   Influenza B by PCR NEGATIVE NEGATIVE Final    Comment: (NOTE) The Xpert Xpress SARS-CoV-2/FLU/RSV plus assay is intended as an aid in the diagnosis of influenza from Nasopharyngeal swab specimens and should not be used as a sole basis for treatment. Nasal washings and aspirates are unacceptable for Xpert Xpress SARS-CoV-2/FLU/RSV testing.  Fact Sheet for Patients: BloggerCourse.com  Fact Sheet for Healthcare Providers: SeriousBroker.it  This test is not yet approved or cleared by the [place] FDA and has been authorized for detection and/or diagnosis of SARS-CoV-2 by FDA under an  Emergency Use Authorization (EUA). This EUA will remain in effect (meaning this test can be used) for the duration of the COVID-19 declaration under Section 564(b)(1) of the Act, 21 U.S.C. section 360bbb-3(b)(1), unless the authorization is terminated or revoked.  Performed at [facility], [address] [place], [place] [address]       Studies: No results found.  Scheduled Meds:  doxycycline  100 mg Oral Q12H   DULoxetine  30 mg Oral Daily   enoxaparin (LOVENOX) injection  40 mg  Subcutaneous Q24H   feeding supplement  237 mL Oral TID BM   ferrous sulfate  325 mg Oral Daily   fluticasone furoate-vilanterol  1 puff Inhalation Daily   ipratropium-albuterol  3 mL Nebulization BID   mouth rinse  15 mL Mouth Rinse BID   methylPREDNISolone (SOLU-MEDROL) injection  40 mg Intravenous Q12H   multivitamin with minerals  1 tablet Oral Daily   nicotine  7 mg Transdermal Daily   tamsulosin  0.4 mg Oral Daily    Continuous Infusions:  lactated ringers 50 mL/hr at 02/11/21 1058     LOS: 1 day     Darlin Drop, MD [facility] Pager [phone]  If 7PM-7AM, please contact night-coverage [email or web address] Password [email or web address] 02/11/2021, 3:00 PM

## 2021-02-12 MED ORDER — SODIUM CHLORIDE 0.9 % IV SOLN
1.0000 g | INTRAVENOUS | Status: DC
  Administered 2021-02-12: 1 g via INTRAVENOUS
  Filled 2021-02-12: qty 10

## 2021-02-12 NOTE — Progress Notes (Signed)
PROGRESS NOTE  Bob Scott BOF:[id number] DOB: [birth year] DOA: 02/09/2021 PCP: Pcp, No  HPI/Recap of past 24 hours: Bob Scott is a 75 y.o. male with medical history significant of BPH, tobacco use disorder, recent influenza A infection, chronic iron deficiency anemia, chronic peripheral neuropathy, tobacco abuse, presented with worsening of dry cough, shortness of breath, and hypoxia with O2 saturation 82% on room air.  Patient was recently hospitalized for acute hypoxic respiratory failure secondary to influenza A infection, was discharged home 4 days prior to presentation after hypoxia resolved.  But increasingly more, patient again developed shortness of breath, worsening of dry cough and wheezing.  Stabilized on 4 L.  Chest x-ray showed questionable right middle lobe pneumonia, ED started patient on ceftriaxone and azithromycin due to concern for COPD exacerbation and superimposed bacterial pulmonary infection.  IV Solu-Medrol added on 02/10/2021.  02/12/2021: Patient seen at his bedside.  Reports his pain is improving.  Currently on 3 L, will do home oxygen evaluation for DC planning.  Assessment/Plan: Active Problems:   Hypoxic   COPD with acute bronchitis (HCC)   COPD exacerbation (HCC)  Acute hypoxic respiratory failure, suspect multifactorial secondary to COPD exacerbation with concern for superimposed bacterial pulmonary infection. Not on oxygen supplementation at baseline Currently requiring 3 L to maintain O2 saturation greater than 90%. Recently diagnosed with influenza A on 02/02/2021, was discharged home on 02/04/2021 with recommendation to complete Tamiflu and to continue steroid for a few more days.  At discharge he was not requiring any oxygen supplementation. Returns due to worsening symptoms Continue IV Solu-Medrol and Rocephin due to suspected superimposed bacterial infection. MRSA screening test negative. Personally reviewed CT scan done on 02/09/2021 showing  bronchiectasis with scattered patchy densities in the lower lungs, right middle lobe and lingula. Wean off oxygen supplementation as tolerated Home O2 evaluation for DC planning.  Influenza A viral infection with concern for possible superimposed bacterial pulmonary infection. First tested positive on 02/02/2021, repeated test on 02/09/2021 positive. Per the patient he was compliant with his prescribed Tamiflu. Continue symptomatic management  Generalized weakness PT OT assessed no further recommendations Continue to mobilize as tolerated.  Dehydration due to poor oral intake Hypovolemic on exam Completed gentle IV fluid hydration LR at 50 cc/h x 2 days. Continue to encourage oral intake.     Code Status: Full code  Family Communication: None at bedside.  Disposition Plan: Likely will discharge to home once his oxygen requirement improves.  Consultants: None.  Procedures: None.  Antimicrobials: Rocephin, started on 02/10/2021.  DVT prophylaxis: Subcu Lovenox daily  Status is: Inpatient  Inpatient status.  Patient will require at least 2 midnights for further evaluation and treatment of present condition.      Objective: Vitals:   02/11/21 2033 02/12/21 0612 02/12/21 0848 02/12/21 0942  BP:  (!) 169/75 (!) 169/94   Pulse:  (!) 48 63   Resp:   17   Temp:  97.7 F (36.5 C) 98.2 F (36.8 C)   TempSrc:  Oral Oral   SpO2: 97% 100% 100% 97%  Weight:      Height:        Intake/Output Summary (Last 24 hours) at 02/12/2021 1553 Last data filed at 02/12/2021 0612 Gross per 24 hour  Intake --  Output 500 ml  Net -500 ml   Filed Weights   02/09/21 1053  Weight: 65.8 kg    Exam:  General: 75 y.o. year-old male frail-appearing no acute distress.  He is alert and  oriented x3.   Cardiovascular: Regular rate and rhythm no rubs or gallops.  No JVD or thyromegaly noted. Respiratory: Mild rales at bases no wheezing noted.  Poor inspiratory effort.   Abdomen: Soft  nontender normal bowel sounds present.  Musculoskeletal: No lower extremity edema bilaterally.   Skin: No ulcerative lesions noted.   Psychiatry: Mood is appropriate for condition and setting.   Data Reviewed: CBC: Recent Labs  Lab 02/09/21 1227 02/09/21 1256  WBC 9.0  --   NEUTROABS 5.2  --   HGB 14.0 15.3  HCT 44.3 45.0  MCV 76.2*  --   PLT 200  --    Basic Metabolic Panel: Recent Labs  Lab 02/09/21 1227 02/09/21 1256  NA 138 138  K 3.9 3.9  CL 99  --   CO2 30  --   GLUCOSE 100*  --   BUN 8  --   CREATININE 0.65  --   CALCIUM 9.5  --    GFR: Estimated Creatinine Clearance: 66.7 mL/min (by C-G formula based on SCr of 0.65 mg/dL). Liver Function Tests: Recent Labs  Lab 02/09/21 1227  AST 26  ALT 31  ALKPHOS 69  BILITOT 0.5  PROT 8.3*  ALBUMIN 3.5   No results for input(s): LIPASE, AMYLASE in the last 168 hours. No results for input(s): AMMONIA in the last 168 hours. Coagulation Profile: No results for input(s): INR, PROTIME in the last 168 hours. Cardiac Enzymes: No results for input(s): CKTOTAL, CKMB, CKMBINDEX, TROPONINI in the last 168 hours. BNP (last 3 results) No results for input(s): PROBNP in the last 8760 hours. HbA1C: No results for input(s): HGBA1C in the last 72 hours. CBG: No results for input(s): GLUCAP in the last 168 hours. Lipid Profile: No results for input(s): CHOL, HDL, LDLCALC, TRIG, CHOLHDL, LDLDIRECT in the last 72 hours. Thyroid Function Tests: No results for input(s): TSH, T4TOTAL, FREET4, T3FREE, THYROIDAB in the last 72 hours. Anemia Panel: No results for input(s): VITAMINB12, FOLATE, FERRITIN, TIBC, IRON, RETICCTPCT in the last 72 hours. Urine analysis:    Component Value Date/Time   BILIRUBINUR negative 02/25/2015 1655   KETONESUR negative 02/25/2015 1655   PROTEINUR negative 02/25/2015 1655   UROBILINOGEN 0.2 02/25/2015 1655   NITRITE Negative 02/25/2015 1655   LEUKOCYTESUR Negative 02/25/2015 1655   Sepsis  Labs: @LABRCNTIP (procalcitonin:4,lacticidven:4)  ) Recent Results (from the past 240 hour(s))  Resp Panel by RT-PCR (Flu A&B, Covid) Nasopharyngeal Swab     Status: Abnormal   Collection Time: 02/09/21  2:13 PM   Specimen: Nasopharyngeal Swab; Nasopharyngeal(NP) swabs in vial transport medium  Result Value Ref Range Status   SARS Coronavirus 2 by RT PCR NEGATIVE NEGATIVE Final    Comment: (NOTE) SARS-CoV-2 target nucleic acids are NOT DETECTED.  The SARS-CoV-2 RNA is generally detectable in upper respiratory specimens during the acute phase of infection. The lowest concentration of SARS-CoV-2 viral copies this assay can detect is 138 copies/mL. A negative result does not preclude SARS-Cov-2 infection and should not be used as the sole basis for treatment or other patient management decisions. A negative result may occur with  improper specimen collection/handling, submission of specimen other than nasopharyngeal swab, presence of viral mutation(s) within the areas targeted by this assay, and inadequate number of viral copies(<138 copies/mL). A negative result must be combined with clinical observations, patient history, and epidemiological information. The expected result is Negative.  Fact Sheet for Patients:  13/03/22  Fact Sheet for Healthcare Providers:  BloggerCourse.com  This test is  no t yet approved or cleared by the [place] and  has been authorized for detection and/or diagnosis of SARS-CoV-2 by FDA under an Emergency Use Authorization (EUA). This EUA will remain  in effect (meaning this test can be used) for the duration of the COVID-19 declaration under Section 564(b)(1) of the Act, 21 U.S.C.section 360bbb-3(b)(1), unless the authorization is terminated  or revoked sooner.       Influenza A by PCR POSITIVE (A) NEGATIVE Final   Influenza B by PCR NEGATIVE NEGATIVE Final    Comment: (NOTE) The  Xpert Xpress SARS-CoV-2/FLU/RSV plus assay is intended as an aid in the diagnosis of influenza from Nasopharyngeal swab specimens and should not be used as a sole basis for treatment. Nasal washings and aspirates are unacceptable for Xpert Xpress SARS-CoV-2/FLU/RSV testing.  Fact Sheet for Patients: BloggerCourse.com  Fact Sheet for Healthcare Providers: SeriousBroker.it  This test is not yet approved or cleared by the [place] FDA and has been authorized for detection and/or diagnosis of SARS-CoV-2 by FDA under an Emergency Use Authorization (EUA). This EUA will remain in effect (meaning this test can be used) for the duration of the COVID-19 declaration under Section 564(b)(1) of the Act, 21 U.S.C. section 360bbb-3(b)(1), unless the authorization is terminated or revoked.  Performed at [facility], [address], [place], [place] [address]   MRSA Next Gen by PCR, Nasal     Status: None   Collection Time: 02/11/21  4:50 PM   Specimen: Nasal Mucosa; Nasal Swab  Result Value Ref Range Status   MRSA by PCR Next Gen NOT DETECTED NOT DETECTED Final    Comment: (NOTE) The GeneXpert MRSA Assay (FDA approved for NASAL specimens only), is one component of a comprehensive MRSA colonization surveillance program. It is not intended to diagnose MRSA infection nor to guide or monitor treatment for MRSA infections. Test performance is not FDA approved in patients less than 49 years old. Performed at [facility], [address] [place], [place] [address]       Studies: No results found.  Scheduled Meds:  doxycycline  100 mg Oral Q12H   DULoxetine  30 mg Oral Daily   enoxaparin (LOVENOX) injection  40 mg Subcutaneous Q24H   feeding supplement  237 mL Oral TID BM   ferrous sulfate  325 mg Oral Daily   fluticasone furoate-vilanterol  1 puff Inhalation Daily   ipratropium-albuterol  3 mL Nebulization BID   mouth  rinse  15 mL Mouth Rinse BID   methylPREDNISolone (SOLU-MEDROL) injection  40 mg Intravenous Q12H   multivitamin with minerals  1 tablet Oral Daily   nicotine  7 mg Transdermal Daily   tamsulosin  0.4 mg Oral Daily    Continuous Infusions:     LOS: 2 days     Darlin Drop, MD [facility] Pager [phone]  If 7PM-7AM, please contact night-coverage [email or web address] Password [facility] 02/12/2021, 3:53 PM

## 2021-02-13 MED ORDER — NICOTINE 7 MG/24HR TD PT24: 7.0000 mg | MEDICATED_PATCH | Freq: Every day | TRANSDERMAL | 0 refills | Status: AC

## 2021-02-13 MED ORDER — ADULT MULTIVITAMIN W/MINERALS CH: 1.0000 | ORAL_TABLET | Freq: Every day | ORAL | 0 refills | Status: AC

## 2021-02-13 MED ORDER — SODIUM CHLORIDE 0.9 % IV SOLN
1.0000 g | INTRAVENOUS | Status: AC
  Administered 2021-02-13: 1 g via INTRAVENOUS
  Filled 2021-02-13: qty 10

## 2021-02-13 NOTE — Progress Notes (Signed)
SATURATION QUALIFICATIONS: (This note is used to comply with regulatory documentation for home oxygen)  Patient Saturations on Room Air at Rest = 91%  Patient Saturations on Room Air while Ambulating = 86%  Patient Saturations on 3 Liters of oxygen while Ambulating = 89%  Addison Lank Mobility Specialist  Phone [phone]

## 2021-02-13 NOTE — Progress Notes (Signed)
Mobility Specialist Progress Note:   02/13/21 1020  Mobility  Activity Ambulated to bathroom  Level of Assistance Standby assist, set-up cues, supervision of patient - no hands on  Assistive Device None  Distance Ambulated (ft) 15 ft  Mobility Ambulated with assistance in room  Mobility Response Tolerated well  Mobility performed by Mobility specialist  $Mobility charge 1 Mobility   Pre Mobility: SpO2 94% Post Mobility: SpO2 90%  Pt ambulated to BR, BM successful. Session preformed on 3LO2. Pt asx during ambulation.  Addison Lank Mobility Specialist  Phone [phone]

## 2021-02-13 NOTE — Care Management (Addendum)
Home oxygen ordered with Bob Scott with [employer]. Discussed with patient. Patient voiced understanding    64 Bob Scott with [employer] reported , patient refused oxygen , he does not want it. NCM messaged MD and nurse.

## 2021-02-13 NOTE — Plan of Care (Signed)

## 2021-02-13 NOTE — Care Management (Signed)
Order for home oxygen. Once oxygenation saturation , ambulation note entered will call [employer] [facility].

## 2021-02-13 NOTE — Discharge Summary (Addendum)
Discharge Summary  Bob Scott ZOX:[id number] DOB: 1946-03-20  PCP: Pcp, No  Admit date: 02/09/2021 Discharge date: 02/13/2021  Time spent: 35 minutes.  Recommendations for Outpatient Follow-up:  Follow-up with PCP Follow-up with medical hematology/oncology Abstain from tobacco use.  Discharge Diagnoses:  Active Hospital Problems   Diagnosis Date Noted   COPD with acute bronchitis (HCC) 02/09/2021   COPD exacerbation (HCC) 02/09/2021   Hypoxic 02/03/2021    Resolved Hospital Problems  No resolved problems to display.    Discharge Condition: Stable  Diet recommendation: Resume previous diet.  Vitals:   02/13/21 0458 02/13/21 0727  BP: (!) 150/79 (!) 147/84  Pulse: 60 (!) 54  Resp:  16  Temp: 98.4 F (36.9 C) 97.9 F (36.6 C)  SpO2: 98% 100%    History of present illness:  Bob Scott is a 75 y.o. male with medical history significant of BPH, tobacco use disorder, recent influenza A infection, chronic iron deficiency anemia for which she follows with hematology oncology Dr. Pamelia Hoit, chronic peripheral neuropathy, tobacco abuse, presented with worsening of dry cough, shortness of breath, and hypoxia with O2 saturation 82% on room air.  Patient was recently hospitalized for acute hypoxic respiratory failure secondary to influenza A infection, was discharged home on 02/04/2021 after hypoxia had resolved.  But patient again developed shortness of breath, worsening of dry cough and wheezing.  Chest x-ray showed questionable right middle lobe pneumonia.  He was started on ceftriaxone and azithromycin due to concern for COPD exacerbation superimposed by bacterial pulmonary infection.  IV Solu-Medrol was added on 02/10/2021.  Received scheduled breathing treatments with improvement of his symptomatology.  Patient advised to completely abstain from tobacco use.   02/13/2021: Patient was seen at his bedside.  He had no new complaints.  There were no acute events overnight.  He is eager to  go home.    He had an home oxygen evaluation done with recommendation for home oxygen supplementation which he declined.  Hospital Course:  Active Problems:   Hypoxic   COPD with acute bronchitis (HCC)   COPD exacerbation (HCC)  Improving acute hypoxic respiratory failure, suspect multifactorial secondary to COPD exacerbation with concern for superimposed bacterial pulmonary infection. Not on oxygen supplementation at baseline Required 3 L to maintain O2 saturation greater than 90%. Recently diagnosed with influenza A on 02/02/2021, was discharged home on 02/04/2021 with recommendation to complete Tamiflu and to continue steroid for a few more days.  At discharge he was not requiring any oxygen supplementation. Returns due to worsening symptoms He received IV Solu-Medrol and Rocephin due to suspected superimposed bacterial infection. MRSA screening test negative.   Influenza A viral infection with concern for possible superimposed bacterial pulmonary infection. First tested positive on 02/02/2021, repeated test on 02/09/2021 positive. Per the patient he was compliant with his prescribed Tamiflu.   Generalized weakness PT OT assessed and there were no further recommendations   Dehydration due to poor oral intake Hypovolemic on exam Received gentle IV fluid hydration LR at 50 cc/h x 2 days. Continue to encourage oral intake.  Tobacco use disorder Completely abstain from tobacco use Nicotine patch Follow with your PCP  Iron deficiency anemia Resume home regimen Follow-up with hematology oncology         Code Status: Full code     Consultants: None.   Procedures: None.   Antimicrobials: Rocephin x3 doses. Doxycycline x4 days.    Discharge Exam: BP (!) 147/84 (BP Location: Right Arm)   Pulse (!) 54  Temp 97.9 F (36.6 C) (Oral)   Resp 16   Ht 5\' 2"  (1.575 m)   Wt 65.8 kg   SpO2 100%   BMI 26.52 kg/m  General: 75 y.o. year-old male well developed well  nourished in no acute distress.  Alert and oriented x3. Cardiovascular: Regular rate and rhythm with no rubs or gallops.  No thyromegaly or JVD noted.   Respiratory: Clear to auscultation with no wheezes or rales. Good inspiratory effort. Abdomen: Soft nontender nondistended with normal bowel sounds x4 quadrants. Musculoskeletal: No lower extremity edema. 2/4 pulses in all 4 extremities. Skin: No ulcerative lesions noted or rashes, Psychiatry: Mood is appropriate for condition and setting  Discharge Instructions You were cared for by a hospitalist during your hospital stay. If you have any questions about your discharge medications or the care you received while you were in the hospital after you are discharged, you can call the unit and asked to speak with the hospitalist on call if the hospitalist that took care of you is not available. Once you are discharged, your primary care physician will handle any further medical issues. Please note that NO REFILLS for any discharge medications will be authorized once you are discharged, as it is imperative that you return to your primary care physician (or establish a relationship with a primary care physician if you do not have one) for your aftercare needs so that they can reassess your need for medications and monitor your lab values.   Allergies as of 02/13/2021   No Known Allergies      Medication List     STOP taking these medications    azelastine 0.05 % ophthalmic solution Commonly known as: OPTIVAR   fluticasone 50 MCG/ACT nasal spray Commonly known as: FLONASE   gabapentin 100 MG capsule Commonly known as: NEURONTIN   oseltamivir 75 MG capsule Commonly known as: TAMIFLU   predniSONE 20 MG tablet Commonly known as: DELTASONE       TAKE these medications    albuterol 108 (90 Base) MCG/ACT inhaler Commonly known as: VENTOLIN HFA Inhale 2 puffs into the lungs every 6 (six) hours as needed for wheezing or shortness of  breath.   albuterol (2.5 MG/3ML) 0.083% nebulizer solution Commonly known as: PROVENTIL Take 3 mLs (2.5 mg total) by nebulization every 4 (four) hours as needed for wheezing or shortness of breath.   budesonide 0.25 MG/2ML nebulizer solution Commonly known as: PULMICORT Take 2 mLs (0.25 mg total) by nebulization 2 (two) times daily.   DULoxetine 30 MG capsule Commonly known as: CYMBALTA TAKE 1 CAPSULE BY MOUTH EVERY DAY What changed: how much to take   guaiFENesin-dextromethorphan 100-10 MG/5ML syrup Commonly known as: ROBITUSSIN DM Take 5 mLs by mouth every 4 (four) hours as needed for cough. What changed: Another medication with the same name was removed. Continue taking this medication, and follow the directions you see here.   Iron (Ferrous Sulfate) 325 (65 Fe) MG Tabs Take 325 mg by mouth daily.   meloxicam 7.5 MG tablet Commonly known as: MOBIC Take 7.5 mg by mouth daily.   multivitamin with minerals Tabs tablet Take 1 tablet by mouth daily. Start taking on: February 14, 2021   nicotine 7 mg/24hr patch Commonly known as: NICODERM CQ - dosed in mg/24 hr Place 1 patch (7 mg total) onto the skin daily. Start taking on: February 14, 2021   tamsulosin 0.4 MG Caps capsule Commonly known as: FLOMAX Take 1 capsule (0.4 mg total) by  mouth daily.               Durable Medical Equipment  (From admission, onward)           Start     Ordered   02/13/21 0953  For home use only DME oxygen  Once       Question Answer Comment  Length of Need 6 Months   Mode or (Route) Nasal cannula   Liters per Minute 3   Frequency Continuous (stationary and portable oxygen unit needed)   Oxygen conserving device Yes   Oxygen delivery system Gas      02/13/21 0952           No Known Allergies  Follow-up Information     [facility] Follow up in 1 day(s).   Why: Please call for a posthospital follow-up appointment. Contact information: [address]  [address] [place] [address] [phone]        Hoy Register, MD. Call in 1 day(s).   Specialty: Family Medicine Why: Please call for a post hospital follow-up appointment. Contact information: [address] [place] [address] [phone]         Serena Croissant, MD. Call in 1 day(s).   Specialty: Hematology and Oncology Why: Please call for a posthospital follow-up appointment. Contact information: [address] [place] [address] [phone]         [facility] Follow up.   Contact information: [address] [place] [address] [phone]                  The results of significant diagnostics from this hospitalization (including imaging, microbiology, ancillary and laboratory) are listed below for reference.    Significant Diagnostic Studies: DG Chest 2 View  Result Date: 02/09/2021 CLINICAL DATA:  sob, sats 86 RA, recent hopsilization, positive flu EXAM: CHEST - 2 VIEW COMPARISON:  February 02, 2021. FINDINGS: Chronic hyperinflation. Vague opacities in the right mid lung. No visible pleural effusions or pneumothorax. Cardiomediastinal silhouette is within normal limits and similar to prior. Calcific atherosclerosis of the aorta. No acute osseous abnormality. IMPRESSION: 1. Vague opacities in the right mid lung, which could represent atelectasis/scar or early pneumonia. 2. Chronic hyperinflation, compatible with emphysema. Electronically Signed   By: Feliberto Harts M.D.   On: 02/09/2021 10:21   CT CHEST WO CONTRAST  Result Date: 02/09/2021 CLINICAL DATA:  Respiratory failure. EXAM: CT CHEST WITHOUT CONTRAST TECHNIQUE: Multidetector CT imaging of the chest was performed following the standard protocol without IV contrast. COMPARISON:  Chest radiograph 02/09/2021 FINDINGS: Cardiovascular: Atherosclerotic calcifications involving the thoracic aorta. Normal caliber of the thoracic aorta.  Heart size is normal without significant pericardial fluid. Mediastinum/Nodes: No evidence for mediastinal lymph node enlargement. Limited evaluation for hilar lymph nodes without intravascular contrast. No axillary lymph node enlargement. Esophagus is unremarkable. Lungs/Pleura: Trachea and mainstem bronchi are patent. Mild bronchiectasis in the both lower lobes. Bronchiectasis in the right middle lobe. Scattered nodular and small branching densities in both lungs. Disease is predominantly in the lower lungs and some of the disease has tree-in-bud pattern. There is focal volume loss or consolidation in the lingula and right middle lobe. Findings are suggestive for post inflammatory changes. Mild paraseptal emphysema. No pleural effusions. Upper Abdomen: Images of the upper abdomen are unremarkable. Musculoskeletal: Disc space narrowing and endplate changes in the lower cervical spine. No acute bone abnormality. IMPRESSION: Bronchiectasis with scattered patchy densities in the lower  lungs, right middle lobe and lingula. Findings are suggestive for infectious or inflammatory changes of uncertain chronicity. Aortic Atherosclerosis (ICD10-I70.0) and Emphysema (ICD10-J43.9). Electronically Signed   By: Richarda Overlie M.D.   On: 02/09/2021 15:12   DG Chest Port 1 View  Result Date: 02/02/2021 CLINICAL DATA:  Shortness of breath and wheezing EXAM: PORTABLE CHEST 1 VIEW COMPARISON:  Portable exam 1018 hours compared to 05/10/2016 FINDINGS: Normal heart size, mediastinal contours, and pulmonary vascularity. Atherosclerotic calcification aorta. Lungs hyperinflated but clear. No pulmonary infiltrate, pleural effusion, or pneumothorax. Osseous structures unremarkable. IMPRESSION: Hyperinflated lungs without acute infiltrate. Aortic Atherosclerosis (ICD10-I70.0). Electronically Signed   By: Ulyses Southward M.D.   On: 02/02/2021 10:24    Microbiology: Recent Results (from the past 240 hour(s))  Resp Panel by RT-PCR (Flu A&B,  Covid) Nasopharyngeal Swab     Status: Abnormal   Collection Time: 02/09/21  2:13 PM   Specimen: Nasopharyngeal Swab; Nasopharyngeal(NP) swabs in vial transport medium  Result Value Ref Range Status   SARS Coronavirus 2 by RT PCR NEGATIVE NEGATIVE Final    Comment: (NOTE) SARS-CoV-2 target nucleic acids are NOT DETECTED.  The SARS-CoV-2 RNA is generally detectable in upper respiratory specimens during the acute phase of infection. The lowest concentration of SARS-CoV-2 viral copies this assay can detect is 138 copies/mL. A negative result does not preclude SARS-Cov-2 infection and should not be used as the sole basis for treatment or other patient management decisions. A negative result may occur with  improper specimen collection/handling, submission of specimen other than nasopharyngeal swab, presence of viral mutation(s) within the areas targeted by this assay, and inadequate number of viral copies(<138 copies/mL). A negative result must be combined with clinical observations, patient history, and epidemiological information. The expected result is Negative.  Fact Sheet for Patients:  BloggerCourse.com  Fact Sheet for Healthcare Providers:  SeriousBroker.it  This test is no t yet approved or cleared by the [place] FDA and  has been authorized for detection and/or diagnosis of SARS-CoV-2 by FDA under an Emergency Use Authorization (EUA). This EUA will remain  in effect (meaning this test can be used) for the duration of the COVID-19 declaration under Section 564(b)(1) of the Act, 21 U.S.C.section 360bbb-3(b)(1), unless the authorization is terminated  or revoked sooner.       Influenza A by PCR POSITIVE (A) NEGATIVE Final   Influenza B by PCR NEGATIVE NEGATIVE Final    Comment: (NOTE) The Xpert Xpress SARS-CoV-2/FLU/RSV plus assay is intended as an aid in the diagnosis of influenza from Nasopharyngeal swab specimens  and should not be used as a sole basis for treatment. Nasal washings and aspirates are unacceptable for Xpert Xpress SARS-CoV-2/FLU/RSV testing.  Fact Sheet for Patients: BloggerCourse.com  Fact Sheet for Healthcare Providers: SeriousBroker.it  This test is not yet approved or cleared by the [place] FDA and has been authorized for detection and/or diagnosis of SARS-CoV-2 by FDA under an Emergency Use Authorization (EUA). This EUA will remain in effect (meaning this test can be used) for the duration of the COVID-19 declaration under Section 564(b)(1) of the Act, 21 U.S.C. section 360bbb-3(b)(1), unless the authorization is terminated or revoked.  Performed at [facility], [address], [place], [place] [address]   MRSA Next Gen by PCR, Nasal     Status: None   Collection Time: 02/11/21  4:50 PM   Specimen: Nasal Mucosa; Nasal Swab  Result Value Ref Range Status   MRSA by PCR Next Gen NOT  DETECTED NOT DETECTED Final    Comment: (NOTE) The GeneXpert MRSA Assay (FDA approved for NASAL specimens only), is one component of a comprehensive MRSA colonization surveillance program. It is not intended to diagnose MRSA infection nor to guide or monitor treatment for MRSA infections. Test performance is not FDA approved in patients less than 31 years old. Performed at [facility], [address] [place], [place] [address]      Labs: Basic Metabolic Panel: Recent Labs  Lab 02/09/21 1227 02/09/21 1256  NA 138 138  K 3.9 3.9  CL 99  --   CO2 30  --   GLUCOSE 100*  --   BUN 8  --   CREATININE 0.65  --   CALCIUM 9.5  --    Liver Function Tests: Recent Labs  Lab 02/09/21 1227  AST 26  ALT 31  ALKPHOS 69  BILITOT 0.5  PROT 8.3*  ALBUMIN 3.5   No results for input(s): LIPASE, AMYLASE in the last 168 hours. No results for input(s): AMMONIA in the last 168 hours. CBC: Recent Labs  Lab 02/09/21 1227  02/09/21 1256  WBC 9.0  --   NEUTROABS 5.2  --   HGB 14.0 15.3  HCT 44.3 45.0  MCV 76.2*  --   PLT 200  --    Cardiac Enzymes: No results for input(s): CKTOTAL, CKMB, CKMBINDEX, TROPONINI in the last 168 hours. BNP: BNP (last 3 results) Recent Labs    02/09/21 1227  BNP 23.7    ProBNP (last 3 results) No results for input(s): PROBNP in the last 8760 hours.  CBG: No results for input(s): GLUCAP in the last 168 hours.     Signed:  Darlin Drop, MD [facility] 02/13/2021, 4:03 PM

## 2021-02-13 NOTE — Plan of Care (Signed)

## 2021-04-13 ENCOUNTER — Other Ambulatory Visit: Payer: Self-pay

## 2021-04-13 DIAGNOSIS — D509 Iron deficiency anemia, unspecified: Secondary | ICD-10-CM

## 2021-04-13 NOTE — Assessment & Plan Note (Deleted)
Unclear etiology.  He has not noticed any blood in the stool Lab review: 05/30/2013: Hemoglobin 11.6, MCV 77.3 07/13/2019: Hemoglobin 9.1, MCV 70 12/13/2020: Hemoglobin 7, MCV 62, iron saturation 2%, ferritin 12, TIBC 481  IV Iron: Oct 2022 Hosp adm: 02/09/21- 02/13/21: COPD exac

## 2021-04-13 NOTE — Progress Notes (Incomplete)
Patient Care Team: Pcp, No as PCP - General  DIAGNOSIS: No diagnosis found.  CHIEF COMPLIANT: Follow-up of IDA  INTERVAL HISTORY: Bob Scott is a 76 y.o. with above-mentioned history of IDA. He presents to the clinic today for follow-up.   ALLERGIES:  has No Known Allergies.  MEDICATIONS:  Current Outpatient Medications  Medication Sig Dispense Refill   albuterol (PROVENTIL) (2.5 MG/3ML) 0.083% nebulizer solution Take 3 mLs (2.5 mg total) by nebulization every 4 (four) hours as needed for wheezing or shortness of breath. 75 mL 2   albuterol (VENTOLIN HFA) 108 (90 Base) MCG/ACT inhaler Inhale 2 puffs into the lungs every 6 (six) hours as needed for wheezing or shortness of breath. 8 g 2   budesonide (PULMICORT) 0.25 MG/2ML nebulizer solution Take 2 mLs (0.25 mg total) by nebulization 2 (two) times daily. 60 mL 2   DULoxetine (CYMBALTA) 30 MG capsule TAKE 1 CAPSULE BY MOUTH EVERY DAY (Patient taking differently: Take 30 mg by mouth daily.) 90 capsule 1   guaiFENesin-dextromethorphan (ROBITUSSIN DM) 100-10 MG/5ML syrup Take 5 mLs by mouth every 4 (four) hours as needed for cough. 118 mL 0   Iron, Ferrous Sulfate, 325 (65 Fe) MG TABS Take 325 mg by mouth daily. 60 tablet 3   meloxicam (MOBIC) 7.5 MG tablet Take 7.5 mg by mouth daily.     Multiple Vitamin (MULTIVITAMIN WITH MINERALS) TABS tablet Take 1 tablet by mouth daily. 90 tablet 0   nicotine (NICODERM CQ - DOSED IN MG/24 HR) 7 mg/24hr patch Place 1 patch (7 mg total) onto the skin daily. 28 patch 0   tamsulosin (FLOMAX) 0.4 MG CAPS capsule Take 1 capsule (0.4 mg total) by mouth daily. 30 capsule 3   No current facility-administered medications for this visit.    PHYSICAL EXAMINATION: ECOG PERFORMANCE STATUS: {CHL ONC ECOG [phone]}  There were no vitals filed for this visit. There were no vitals filed for this visit.  LABORATORY DATA:  I have reviewed the data as listed CMP Latest Ref Rng & Units 02/09/2021 02/09/2021  02/03/2021  Glucose 70 - 99 mg/dL - 100(H) 126(H)  BUN 8 - 23 mg/dL - 8 10  Creatinine 0.61 - 1.24 mg/dL - 0.65 0.59(L)  Sodium 135 - 145 mmol/L 138 138 136  Potassium 3.5 - 5.1 mmol/L 3.9 3.9 4.2  Chloride 98 - 111 mmol/L - 99 103  CO2 22 - 32 mmol/L - 30 27  Calcium 8.9 - 10.3 mg/dL - 9.5 8.4(L)  Total Protein 6.5 - 8.1 g/dL - 8.3(H) 6.5  Total Bilirubin 0.3 - 1.2 mg/dL - 0.5 0.4  Alkaline Phos 38 - 126 U/L - 69 49  AST 15 - 41 U/L - 26 39  ALT 0 - 44 U/L - 31 27    Lab Results  Component Value Date   WBC 9.0 02/09/2021   HGB 15.3 02/09/2021   HCT 45.0 02/09/2021   MCV 76.2 (L) 02/09/2021   PLT 200 02/09/2021   NEUTROABS 5.2 02/09/2021    ASSESSMENT & PLAN:  No problem-specific Assessment & Plan notes found for this encounter.    No orders of the defined types were placed in this encounter.  The patient has a good understanding of the overall plan. he agrees with it. he will call with any problems that may develop before the next visit here.  Total time spent: *** mins including face to face time and time spent for planning, charting and coordination of care  Rulon Eisenmenger,  MD, MPH 04/13/2021  I, Thana Ates, am acting as scribe for Dr. Nicholas Lose.  {insert scribe attestation}

## 2021-04-14 ENCOUNTER — Inpatient Hospital Stay: Payer: BC Managed Care – PPO | Admitting: Hematology and Oncology

## 2021-04-14 ENCOUNTER — Inpatient Hospital Stay: Payer: BC Managed Care – PPO | Attending: Hematology and Oncology

## 2021-04-14 DIAGNOSIS — D509 Iron deficiency anemia, unspecified: Secondary | ICD-10-CM

## 2022-02-08 ENCOUNTER — Other Ambulatory Visit: Payer: Self-pay | Admitting: Physician Assistant

## 2022-02-08 DIAGNOSIS — E041 Nontoxic single thyroid nodule: Secondary | ICD-10-CM

## 2022-02-08 DIAGNOSIS — Z87891 Personal history of nicotine dependence: Secondary | ICD-10-CM

## 2022-07-28 IMAGING — DX DG CHEST 2V
2 series · 2 of 2 positions shown · non-contrast
Comparison: February 02, 2021.

CLINICAL DATA: sob, sats 86 RA, recent hopsilization, positive flu

EXAM:
CHEST - 2 VIEW

[chest pa]
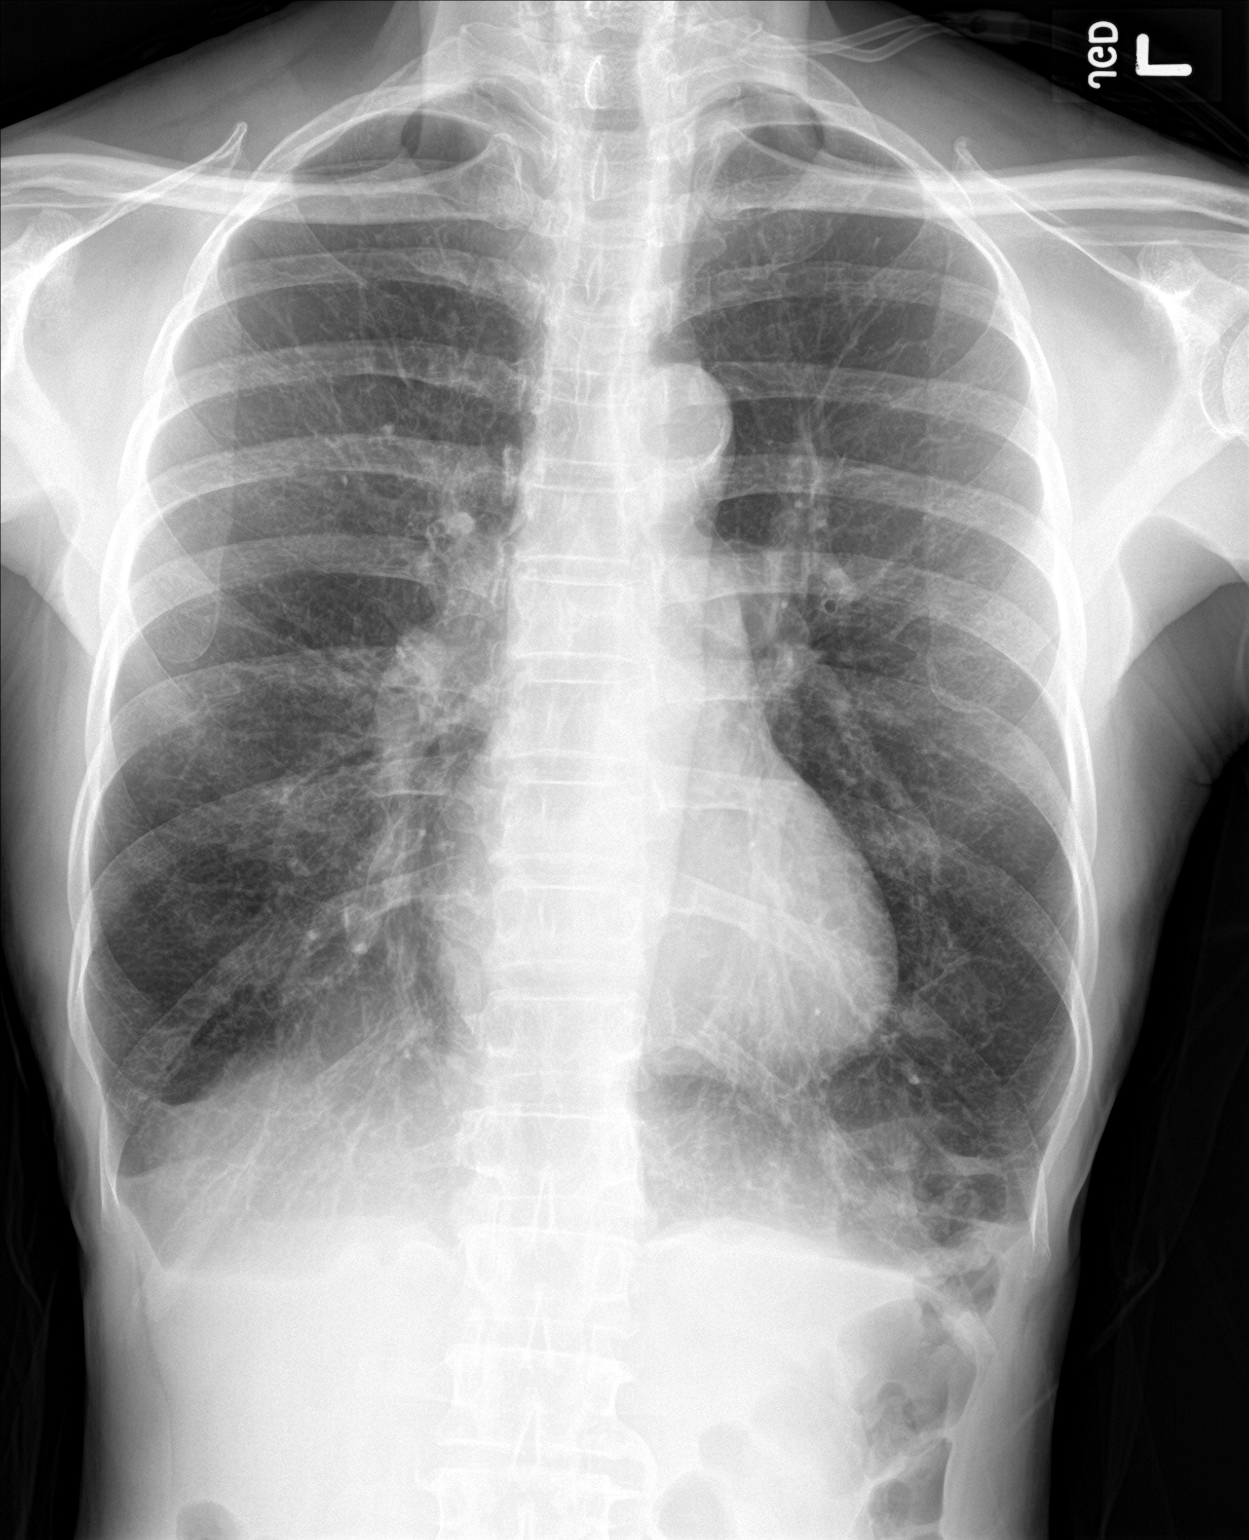

[chest lat]
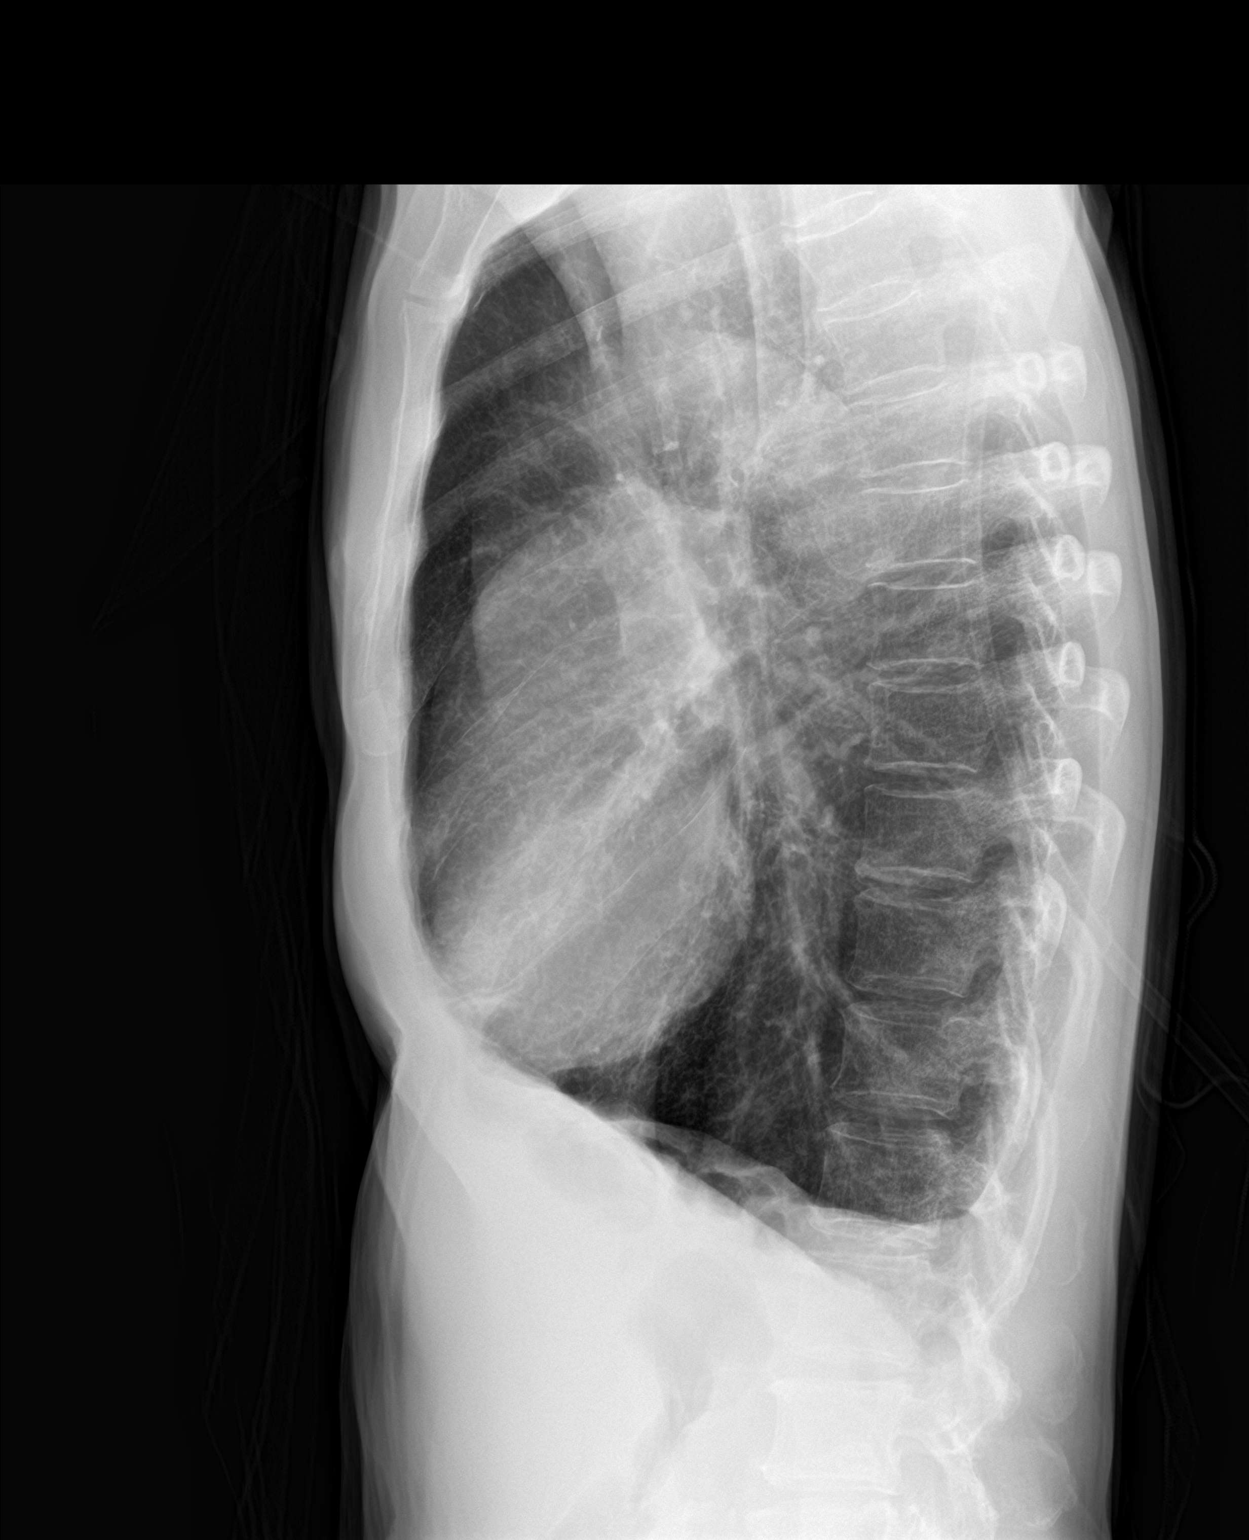

[2 of 2 positions shown; findings below may reference images not displayed]

FINDINGS: Chronic hyperinflation. Vague opacities in the right mid lung. No
visible pleural effusions or pneumothorax. Cardiomediastinal
silhouette is within normal limits and similar to prior. Calcific
atherosclerosis of the aorta. No acute osseous abnormality.
IMPRESSION: 1. Vague opacities in the right mid lung, which could represent
atelectasis/scar or early pneumonia.
2. Chronic hyperinflation, compatible with emphysema.

## 2022-07-28 IMAGING — CT CT CHEST W/O CM
2 of 4 series · 15 of 36 positions shown, 18 images · non-contrast
Comparison: Chest radiograph 02/09/2021

CLINICAL DATA: Respiratory failure.

EXAM:
CT CHEST WITHOUT CONTRAST
TECHNIQUE: Multidetector CT imaging of the chest was performed following the
standard protocol without IV contrast.

[Series 4: thorax 2.0 · axial · 0.63mm/px · z∈[+1100,+1444]mm · 12 of 194 slices shown, 15 images]
[im 11/194  mediastinal]
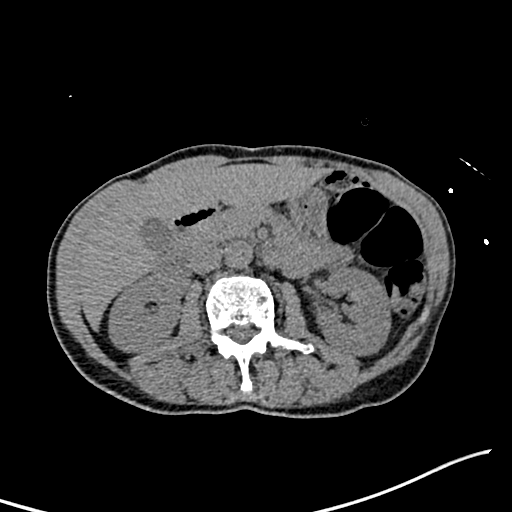
[im 11/194  lung]
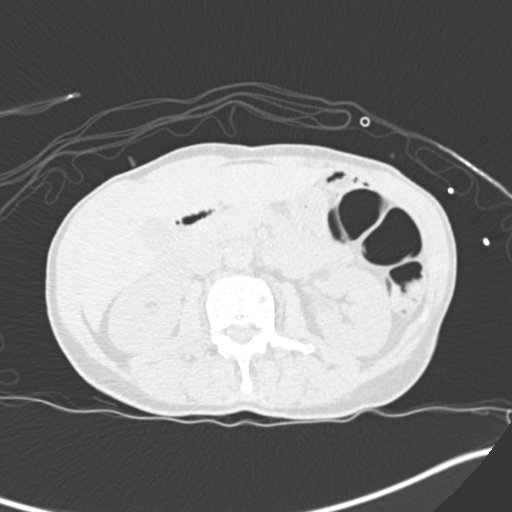
[im 31/194  lung]
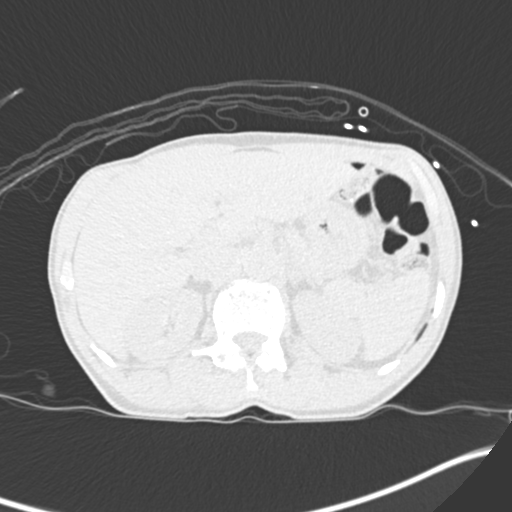
[im 41/194  lung]
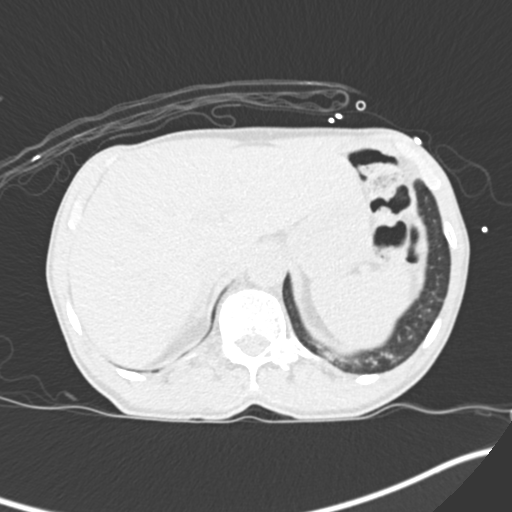
[im 61/194  lung]
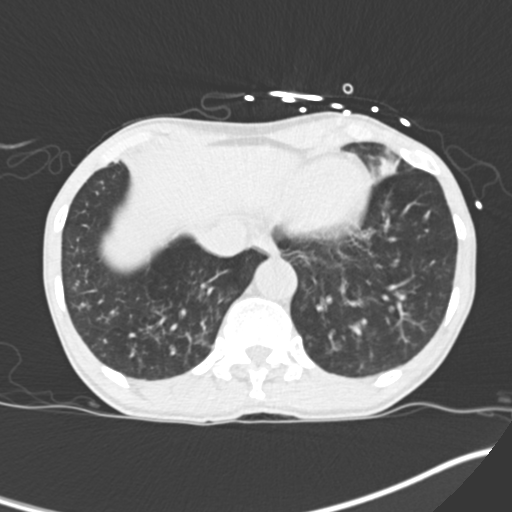
[im 72/194  mediastinal]
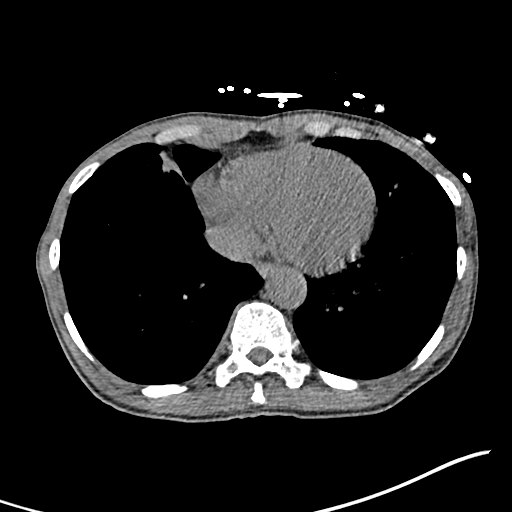
[im 72/194  lung]
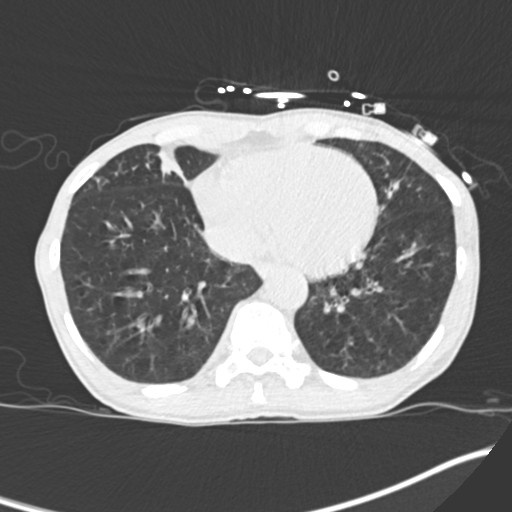
[im 92/194  lung]
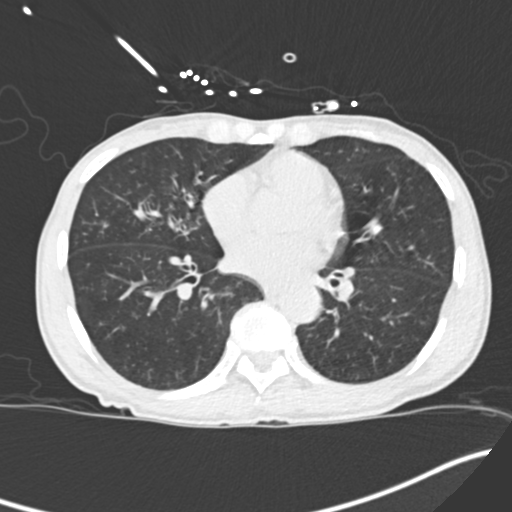
[im 102/194  lung]
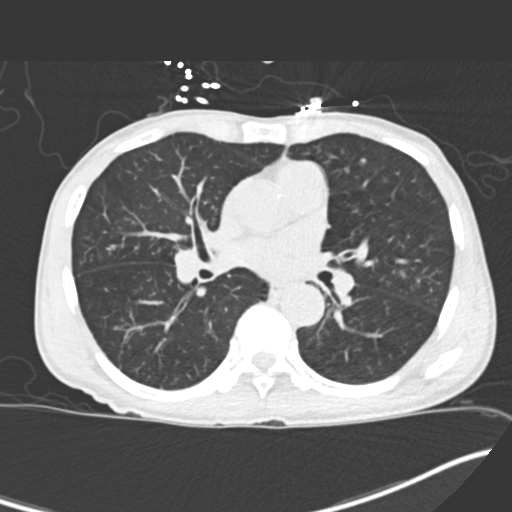
[im 122/194  lung]
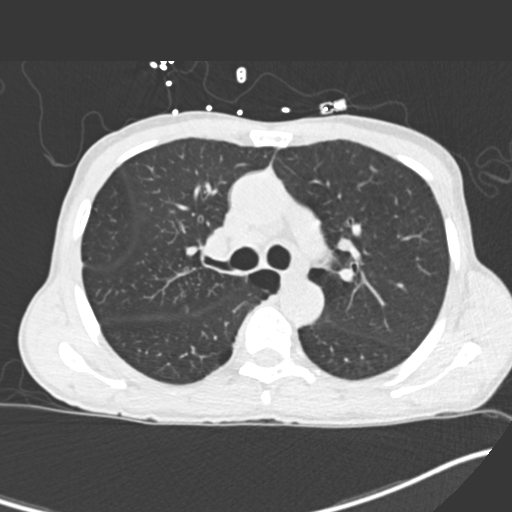
[im 133/194  mediastinal]
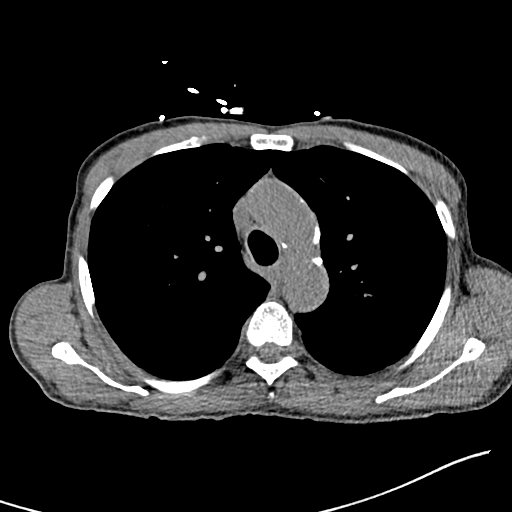
[im 133/194  lung]
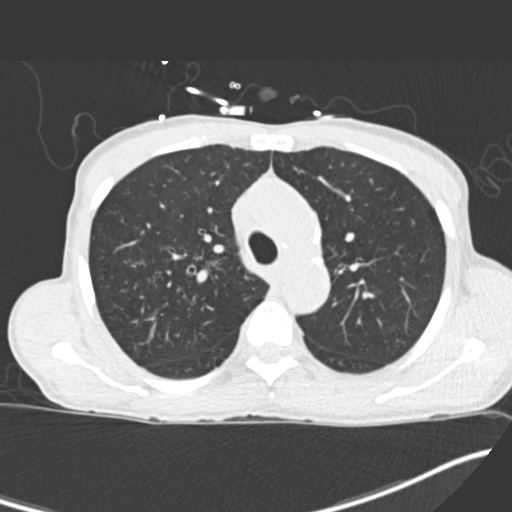
[im 153/194  lung]
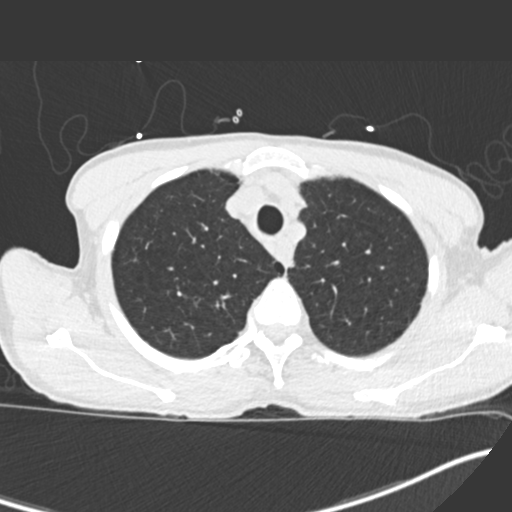
[im 163/194  lung]
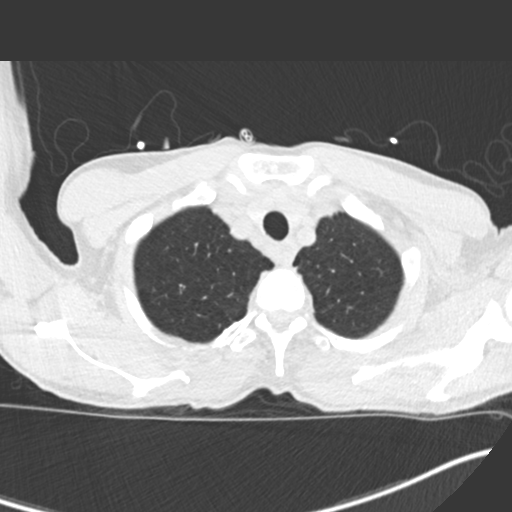
[im 183/194  lung]
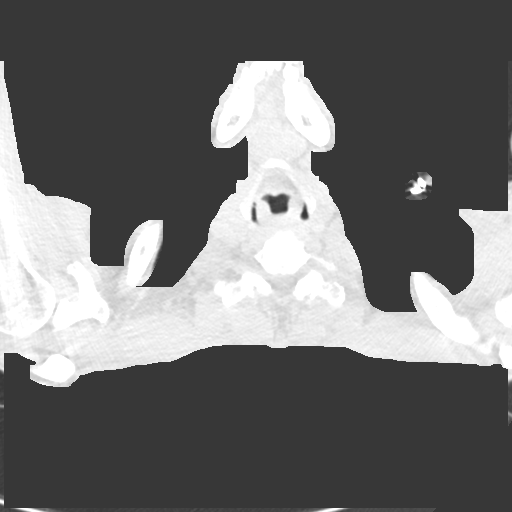

[Series 6: coronal · coronal · 0.66mm/px · 3 of 85 slices shown]
[im 17/85  lung]
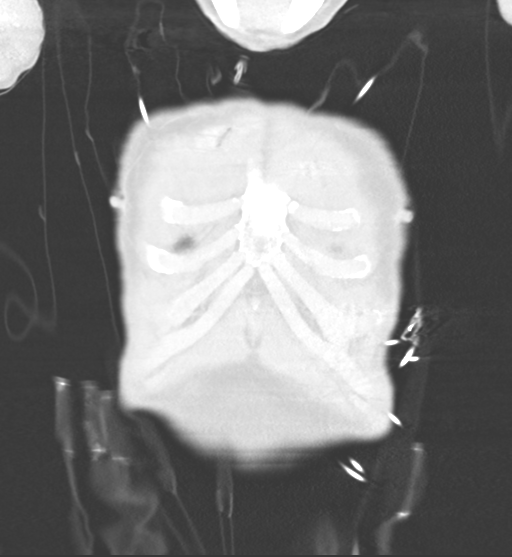
[im 34/85  lung]
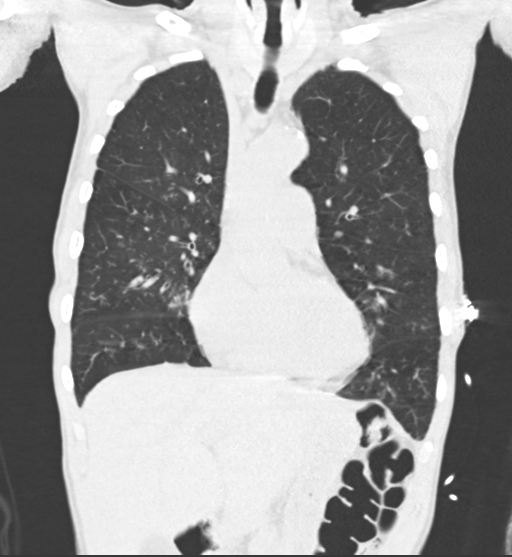
[im 51/85  lung]
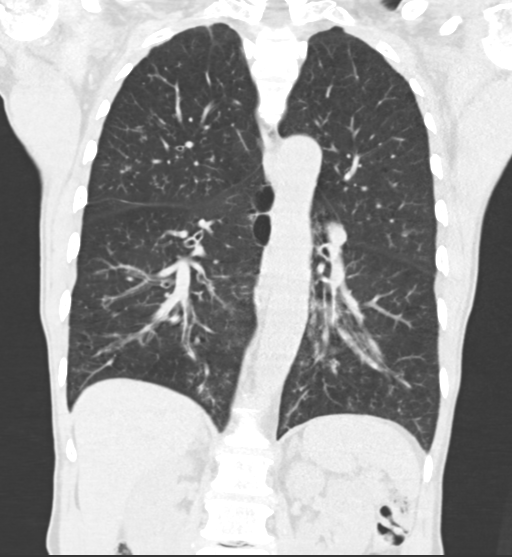

[15 of 36 positions shown; findings below may reference images not displayed]

FINDINGS: Cardiovascular: Atherosclerotic calcifications involving the
thoracic aorta. Normal caliber of the thoracic aorta. Heart size is
normal without significant pericardial fluid.

Mediastinum/Nodes: No evidence for mediastinal lymph node
enlargement. Limited evaluation for hilar lymph nodes without
intravascular contrast. No axillary lymph node enlargement.
Esophagus is unremarkable.

Lungs/Pleura: Trachea and mainstem bronchi are patent. Mild
bronchiectasis in the both lower lobes. Bronchiectasis in the right
middle lobe. Scattered nodular and small branching densities in both
lungs. Disease is predominantly in the lower lungs and some of the
disease has tree-in-bud pattern. There is focal volume loss or
consolidation in the lingula and right middle lobe. Findings are
suggestive for post inflammatory changes. Mild paraseptal emphysema.
No pleural effusions.

Upper Abdomen: Images of the upper abdomen are unremarkable.

Musculoskeletal: Disc space narrowing and endplate changes in the
lower cervical spine. No acute bone abnormality.
IMPRESSION: Bronchiectasis with scattered patchy densities in the lower lungs,
right middle lobe and lingula. Findings are suggestive for
infectious or inflammatory changes of uncertain chronicity.

Aortic Atherosclerosis (E4YKM-FC6.6) and Emphysema (E4YKM-2O8.D).

## 2023-04-08 ENCOUNTER — Encounter: Payer: Self-pay | Admitting: Hematology and Oncology

## 2023-05-06 ENCOUNTER — Encounter: Payer: Self-pay | Admitting: Physician Assistant

## 2023-05-06 ENCOUNTER — Other Ambulatory Visit: Payer: Self-pay | Admitting: Physician Assistant

## 2023-05-06 DIAGNOSIS — F17200 Nicotine dependence, unspecified, uncomplicated: Secondary | ICD-10-CM

## 2023-05-16 ENCOUNTER — Encounter: Payer: Self-pay | Admitting: Hematology and Oncology

## 2023-05-30 ENCOUNTER — Inpatient Hospital Stay: Admission: RE | Admit: 2023-05-30 | Payer: Medicare Other | Source: Ambulatory Visit

## 2023-06-26 ENCOUNTER — Other Ambulatory Visit: Payer: Medicare Other

## 2023-06-28 ENCOUNTER — Other Ambulatory Visit

## 2023-06-28 ENCOUNTER — Ambulatory Visit
Admission: RE | Admit: 2023-06-28 | Discharge: 2023-06-28 | Disposition: A | Discharge status: Home / Self Care | Source: Ambulatory Visit | Attending: Physician Assistant | Admitting: Physician Assistant

## 2023-06-28 DIAGNOSIS — F17200 Nicotine dependence, unspecified, uncomplicated: Secondary | ICD-10-CM
